# Patient Record
Sex: Female | Born: 1990
Health system: Southern US, Community
[De-identification: ages and names within clinical notes are randomized; demographics above are authoritative.]

## PROBLEM LIST (undated history)

## (undated) ENCOUNTER — Inpatient Hospital Stay (HOSPITAL_COMMUNITY): Payer: Self-pay

## (undated) DIAGNOSIS — Z789 Other specified health status: Secondary | ICD-10-CM

## (undated) DIAGNOSIS — B999 Unspecified infectious disease: Secondary | ICD-10-CM

## (undated) DIAGNOSIS — K219 Gastro-esophageal reflux disease without esophagitis: Secondary | ICD-10-CM

## (undated) DIAGNOSIS — B9689 Other specified bacterial agents as the cause of diseases classified elsewhere: Secondary | ICD-10-CM

## (undated) DIAGNOSIS — R12 Heartburn: Secondary | ICD-10-CM

## (undated) DIAGNOSIS — N760 Acute vaginitis: Secondary | ICD-10-CM

## (undated) HISTORY — DX: Gastro-esophageal reflux disease without esophagitis: K21.9

---

## 2009-10-20 ENCOUNTER — Ambulatory Visit (HOSPITAL_COMMUNITY): Admission: RE | Admit: 2009-10-20 | Discharge: 2009-10-20 | Payer: Self-pay | Admitting: Obstetrics & Gynecology

## 2010-03-19 ENCOUNTER — Ambulatory Visit (HOSPITAL_COMMUNITY): Admission: RE | Admit: 2010-03-19 | Discharge: 2010-03-19 | Payer: Self-pay | Admitting: Obstetrics & Gynecology

## 2010-03-19 ENCOUNTER — Inpatient Hospital Stay (HOSPITAL_COMMUNITY): Admission: AD | Admit: 2010-03-19 | Discharge: 2010-03-22 | Payer: Self-pay | Admitting: Obstetrics & Gynecology

## 2011-01-10 LAB — CBC
Hemoglobin: 8.1 g/dL — ABNORMAL LOW (ref 12.0–15.0)
Hemoglobin: 9.5 g/dL — ABNORMAL LOW (ref 12.0–15.0)
MCHC: 32.6 g/dL (ref 30.0–36.0)
MCV: 70.5 fL — ABNORMAL LOW (ref 78.0–100.0)
RBC: 3.49 MIL/uL — ABNORMAL LOW (ref 3.87–5.11)
RDW: 18.2 % — ABNORMAL HIGH (ref 11.5–15.5)
WBC: 10.5 10*3/uL (ref 4.0–10.5)
WBC: 11.3 10*3/uL — ABNORMAL HIGH (ref 4.0–10.5)

## 2011-08-04 ENCOUNTER — Other Ambulatory Visit: Payer: Self-pay | Admitting: Obstetrics

## 2011-08-07 ENCOUNTER — Inpatient Hospital Stay (INDEPENDENT_AMBULATORY_CARE_PROVIDER_SITE_OTHER)
Admission: RE | Admit: 2011-08-07 | Discharge: 2011-08-07 | Disposition: A | Discharge status: Home / Self Care | Payer: BC Managed Care – PPO | Source: Ambulatory Visit | Attending: Family Medicine | Admitting: Family Medicine

## 2011-08-07 DIAGNOSIS — J029 Acute pharyngitis, unspecified: Secondary | ICD-10-CM

## 2011-08-17 ENCOUNTER — Other Ambulatory Visit: Payer: Self-pay | Admitting: Obstetrics

## 2012-02-06 ENCOUNTER — Encounter (HOSPITAL_COMMUNITY): Payer: Self-pay | Admitting: *Deleted

## 2012-02-06 ENCOUNTER — Emergency Department (HOSPITAL_COMMUNITY)
Admission: EM | Admit: 2012-02-06 | Discharge: 2012-02-06 | Disposition: A | Discharge status: Home / Self Care | Payer: BC Managed Care – PPO | Attending: Emergency Medicine | Admitting: Emergency Medicine

## 2012-02-06 ENCOUNTER — Emergency Department (HOSPITAL_COMMUNITY): Payer: BC Managed Care – PPO

## 2012-02-06 DIAGNOSIS — M25529 Pain in unspecified elbow: Secondary | ICD-10-CM | POA: Insufficient documentation

## 2012-02-06 DIAGNOSIS — IMO0001 Reserved for inherently not codable concepts without codable children: Secondary | ICD-10-CM | POA: Insufficient documentation

## 2012-02-06 DIAGNOSIS — M25521 Pain in right elbow: Secondary | ICD-10-CM

## 2012-02-06 MED ORDER — IBUPROFEN 800 MG PO TABS: 800.0000 mg | ORAL_TABLET | Freq: Three times a day (TID) | ORAL | Status: AC
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
## 2012-02-06 NOTE — Discharge Instructions (Signed)
Your x-rays today were normal and did not show signs of any broken bones. This may be a bone bruise. Please apply ice to the area and elevate the arm. Use ibuprofen 3 times daily as needed for pain. If you're not improving, please consider making a followup appointment with your primary care doctor. If you develop numbness, weakness, tingling, noticed changes in the color of your skin, or have any other worrisome symptoms, please return to the ED.  RESOURCE GUIDE  Dental Problems  Patients with Medicaid: [facility] [phone] [address]                                           [phone] [address] Phone:  [phone]                                                  Phone:  [phone]  If unable to pay or uninsured, contact:  [facility] or [facility] to become qualified for the adult dental clinic.  Chronic Pain Problems Contact [facility]  [phone] Patients need to be referred by their primary care doctor.  Insufficient Money for Medicine Contact [employer]:  call "211" or [facility] [phone].  No Primary Care Doctor Call [facility]  [phone] Other agencies that provide inexpensive medical care    [facility]  [phone]    [facility]  [phone]    [facility]  [phone]    [facility]  [phone]    [facility]  [phone]    [facility]  [phone]  Psychological Services [facility] Behavioral Health  [phone] [facility]  [phone] [facility] Mental Health   [phone] (emergency services [phone])  Substance Abuse Resources [facility]  [phone] [facility] [phone] [facility] [place] [phone] Floydene Flock 4790664665 Residential & Outpatient Substance Abuse Program  [phone]  Abuse/Neglect [facility] Child Abuse Hotline 604-169-2568 [facility] Child  Abuse Hotline [phone] (After Hours)  Emergency Shelter [facility] Ministries [phone]  Maternity Homes [facility] at the [place] [facility] [place] [phone] [facility] Services 2294999366  MRSA Hotline #:   (956) 723-9270    [facility] Resources  [facility]     [employer]                          [facility] Dept. [address] [place]                       [address]      [address]  Patrecia Pace  Michell Heinrich Phone:  [phone]                                   Phone:  [phone]                 Phone:  [phone]  [facility] Phone:  [phone]  [facility] Child Abuse Hotline [phone] (After Hours)  Elbow Injury Minor fractures, sprains, and bruises of the elbow will all cause swelling and pain. X-rays often show swelling around the joint but may not show a fracture line on x-rays taken right after the injury. The treatment for all these types of injuries is to reduce swelling and pain and to rest the joint until movement is painless. Repeat exam and x-rays several weeks after an elbow injury may show a minor fracture not seen on the initial exam. Most of the time a sling is needed for the first days or weeks after the injury. Apply ice packs to the elbow for 20-30 minutes every 2 hours for the next few days. Keep your elbow elevated above the level of your heart as much as possible until the pain and swelling are better. An elastic wrap or splint may also be used to reduce movement in addition to a sling. Call your caregiver for follow-up care within one week. Keeping the elbow immobilized for too long can hurt recovery.  SEEK MEDICAL CARE IF:   Your pain increases, or if you develop a numb, cold, or pale forearm or hand.   You are not improving.   You have any other questions or concerns  regarding your injury.  Document Released: 11/17/2004 Document Revised: 09/29/2011 Document Reviewed: 10/29/2008 [facility] [place] [facility] Patient Information 2012 [place], [place].Elbow Injury Minor fractures, sprains, and bruises of the elbow will all cause swelling and pain. X-rays often show swelling around the joint but may not show a fracture line on x-rays taken right after the injury. The treatment for all these types of injuries is to reduce swelling and pain and to rest the joint until movement is painless. Repeat exam and x-rays several weeks after an elbow injury may show a minor fracture not seen on the initial exam. Most of the time a sling is needed for the first days or weeks after the injury. Apply ice packs to the elbow for 20-30 minutes every 2 hours for the next few days. Keep your elbow elevated above the level of your heart as much as possible until the pain and swelling are better. An elastic wrap or splint may also be used to reduce movement in addition to a sling. Call your caregiver for follow-up care within one week. Keeping the elbow immobilized for too long can hurt recovery.  SEEK MEDICAL CARE IF:   Your pain increases, or if you develop a numb, cold, or pale forearm or hand.   You are not improving.   You have any other questions or concerns regarding your injury.  Document Released: 11/17/2004 Document Revised: 09/29/2011 Document Reviewed: 10/29/2008 [facility] Patient Information 2012 [place], [place].

## 2012-02-07 NOTE — ED Provider Notes (Signed)
Medical screening examination/treatment/procedure(s) were performed by non-physician practitioner and as supervising physician I was immediately available for consultation/collaboration.  Ethelda Chick, MD 02/07/12 1524

## 2012-04-24 ENCOUNTER — Encounter (HOSPITAL_COMMUNITY): Payer: Self-pay | Admitting: *Deleted

## 2012-04-24 ENCOUNTER — Emergency Department (HOSPITAL_COMMUNITY)
Admission: EM | Admit: 2012-04-24 | Discharge: 2012-04-25 | Disposition: A | Discharge status: Home / Self Care | Payer: BC Managed Care – PPO | Attending: Emergency Medicine | Admitting: Emergency Medicine

## 2012-04-24 DIAGNOSIS — G43909 Migraine, unspecified, not intractable, without status migrainosus: Secondary | ICD-10-CM

## 2012-04-24 MED ORDER — KETOROLAC TROMETHAMINE 60 MG/2ML IM SOLN
60.0000 mg | Freq: Once | INTRAMUSCULAR | Status: AC
  Administered 2012-04-24: 60 mg via INTRAMUSCULAR
  Filled 2012-04-24: qty 2

## 2012-04-24 MED ORDER — METOCLOPRAMIDE HCL 10 MG PO TABS
10.0000 mg | ORAL_TABLET | Freq: Once | ORAL | Status: AC
  Administered 2012-04-24: 10 mg via ORAL
  Filled 2012-04-24: qty 1

## 2012-04-24 MED ORDER — DIPHENHYDRAMINE HCL 25 MG PO CAPS
25.0000 mg | ORAL_CAPSULE | Freq: Once | ORAL | Status: AC
  Administered 2012-04-24: 25 mg via ORAL
  Filled 2012-04-24: qty 1

## 2012-04-24 NOTE — ED Notes (Signed)
Pt states she is having migraine for about 5 days. Pt states she has had n/v with decrease in appetite. Pt denies any blurred vision or dizziness

## 2012-04-25 LAB — POCT PREGNANCY, URINE: Preg Test, Ur: NEGATIVE

## 2012-04-25 NOTE — ED Notes (Signed)
Pt c/o light sensitivity. Lights dimmed in room. Pt denies nausea at present.

## 2012-04-28 NOTE — ED Provider Notes (Signed)
History     CSN: [id number]  Arrival date & time 04/24/12  2124   First MD Initiated Contact with Patient 04/24/12 2320      Chief Complaint  Patient presents with  . Migraine    (Consider location/radiation/quality/duration/timing/severity/associated sxs/prior treatment) Patient is a 21 y.o. female presenting with migraine. The history is provided by the patient. No language interpreter was used.  Migraine This is a recurrent problem. The current episode started in the past 7 days. The problem occurs daily. The problem has been unchanged. Associated symptoms include headaches, nausea and vomiting. Pertinent negatives include no fever, neck pain, numbness or weakness. The symptoms are aggravated by stress. She has tried NSAIDs for the symptoms. The treatment provided mild relief.  Report migraine intermittant x 5 days.  Photophobia nausea and vomiting today.  Neuro in tact.  Vss.  No neck pain or fever.  History reviewed. No pertinent past medical history.  History reviewed. No pertinent past surgical history.  No family history on file.  History  Substance Use Topics  . Smoking status: Never Smoker   . Smokeless tobacco: Not on file  . Alcohol Use: Yes     occa    OB History    Grav Para Term Preterm Abortions TAB SAB Ect Mult Living                  Review of Systems  Constitutional: Negative.  Negative for fever.  HENT: Negative for neck pain.   Eyes: Positive for photophobia.  Respiratory: Negative.   Cardiovascular: Negative.   Gastrointestinal: Positive for nausea and vomiting.  Neurological: Positive for headaches. Negative for dizziness, facial asymmetry, speech difficulty, weakness and numbness.  Psychiatric/Behavioral: Negative.   All other systems reviewed and are negative.    Allergies  Review of patient's allergies indicates no known allergies.  Home Medications   Current Outpatient Rx  Name Route Sig Dispense Refill  . IBUPROFEN 400 MG PO TABS  Oral Take 400 mg by mouth every 6 (six) hours as needed. For pain relief      BP 117/62  Pulse 75  Temp 98.4 F (36.9 C) (Oral)  Resp 16  SpO2 100%  LMP 03/29/2012  Physical Exam  Nursing note and vitals reviewed. Constitutional: She is oriented to person, place, and time. She appears well-developed and well-nourished.  HENT:  Head: Normocephalic and atraumatic.  Eyes: Conjunctivae and EOM are normal. Pupils are equal, round, and reactive to light.  Neck: Normal range of motion. Neck supple.  Cardiovascular: Normal rate.   Pulmonary/Chest: Effort normal.  Abdominal: Soft.  Musculoskeletal: Normal range of motion. She exhibits no edema and no tenderness.  Neurological: She is alert and oriented to person, place, and time. She has normal strength and normal reflexes. No cranial nerve deficit or sensory deficit. She displays a negative Romberg sign. GCS eye subscore is 4. GCS verbal subscore is 5. GCS motor subscore is 6.  Skin: Skin is warm and dry.  Psychiatric: She has a normal mood and affect.    ED Course  Procedures (including critical care time)   Labs Reviewed  POCT PREGNANCY, URINE  LAB REPORT - SCANNED   No results found.   1. Migraine       MDM  Treated for migraine h/a with migraine cocktail in the ER with relief.  Will follow up with pcp of choice to get rx for preventive meds.  Practice stress reducing techniques. Return if worse.  Remi Haggard, NP 04/28/12 1121

## 2012-04-29 NOTE — ED Provider Notes (Signed)
Medical screening examination/treatment/procedure(s) were performed by non-physician practitioner and as supervising physician I was immediately available for consultation/collaboration.  Julena Barbour T Zakar Brosch, MD 04/29/12 1510 

## 2012-06-23 ENCOUNTER — Encounter (HOSPITAL_COMMUNITY): Payer: Self-pay | Admitting: Obstetrics and Gynecology

## 2012-06-23 ENCOUNTER — Inpatient Hospital Stay (HOSPITAL_COMMUNITY)
Admission: AD | Admit: 2012-06-23 | Discharge: 2012-06-23 | Disposition: A | Discharge status: Home / Self Care | Payer: BC Managed Care – PPO | Source: Ambulatory Visit | Attending: Obstetrics | Admitting: Obstetrics

## 2012-06-23 DIAGNOSIS — N949 Unspecified condition associated with female genital organs and menstrual cycle: Secondary | ICD-10-CM | POA: Insufficient documentation

## 2012-06-23 DIAGNOSIS — B373 Candidiasis of vulva and vagina: Secondary | ICD-10-CM

## 2012-06-23 DIAGNOSIS — N760 Acute vaginitis: Secondary | ICD-10-CM | POA: Insufficient documentation

## 2012-06-23 DIAGNOSIS — B9689 Other specified bacterial agents as the cause of diseases classified elsewhere: Secondary | ICD-10-CM | POA: Insufficient documentation

## 2012-06-23 DIAGNOSIS — A499 Bacterial infection, unspecified: Secondary | ICD-10-CM

## 2012-06-23 DIAGNOSIS — B3731 Acute candidiasis of vulva and vagina: Secondary | ICD-10-CM | POA: Insufficient documentation

## 2012-06-23 HISTORY — DX: Other specified health status: Z78.9

## 2012-06-23 LAB — WET PREP, GENITAL

## 2012-06-23 MED ORDER — METRONIDAZOLE 500 MG PO TABS: 500.0000 mg | ORAL_TABLET | Freq: Two times a day (BID) | ORAL | Status: AC

## 2012-06-23 MED ORDER — FLUCONAZOLE 150 MG PO TABS: 150.0000 mg | ORAL_TABLET | Freq: Once | ORAL | Status: DC

## 2012-06-23 MED ORDER — FLUCONAZOLE 150 MG PO TABS: 150.0000 mg | ORAL_TABLET | Freq: Once | ORAL | Status: AC

## 2012-06-23 NOTE — MAU Note (Signed)
"  I've had vaginal irritation since Tuesday.  I treated myself with Monistat 2 day Tuesday and Wednesday.  It seems like it got worse since then.  Lots of itching and burning.  No odor since then.  Last intercourse x 1 week ago."

## 2012-06-23 NOTE — MAU Note (Signed)
Patient states she has had a thick white vaginal discharge for 4 days. Used OTC 2 day Monistat and seems to have made the vaginal irritation worse. A lot of discomfort and itching.

## 2012-06-23 NOTE — MAU Provider Note (Signed)
Chief Complaint: Vaginal Discharge  First Provider Initiated Contact with Patient 06/23/12 1814     SUBJECTIVE HPI: Mary Mora is a 21 y.o. G2P0011who presents with thick, white vaginal discharge and vaginal and vulvar itching for 4 days. Used OTC 2 day Monistat and seems to have made the vaginal irritation worse. Denies abdominal pain, fever, chills, vaginal odor, dyspareunia, urinary symptoms.  Past Medical History  Diagnosis Date  . No pertinent past medical history    OB History    Grav Para Term Preterm Abortions TAB SAB Ect Mult Living   2 1   1 1    1      # Outc Date GA Lbr Len/2nd Wgt Sex Del Anes PTL Lv   1 PAR 5/11    M SVD EPI No Yes   2 TAB 5/12           Comments: no complications     Past Surgical History  Procedure Date  . No past surgeries    History   Social History  . Marital Status: Single    Spouse Name: N/A    Number of Children: N/A  . Years of Education: N/A   Occupational History  . Not on file.   Social History Main Topics  . Smoking status: Never Smoker   . Smokeless tobacco: Not on file  . Alcohol Use: Yes     occassional  . Drug Use: No  . Sexually Active: Yes    Birth Control/ Protection: Inserts   Other Topics Concern  . Not on file   Social History Narrative  . No narrative on file   No current facility-administered medications on file prior to encounter.   Current Outpatient Prescriptions on File Prior to Encounter  Medication Sig Dispense Refill  . ibuprofen (ADVIL,MOTRIN) 400 MG tablet Take 400 mg by mouth every 6 (six) hours as needed. For pain relief       No Known Allergies  ROS: Pertinent items in HPI  OBJECTIVE Blood pressure 132/70, pulse 84, temperature 99.5 F (37.5 C), temperature source Oral, resp. rate 16, height 5\' 3"  (1.6 m), weight 85.004 kg (187 lb 6.4 oz), last menstrual period 06/04/2012, SpO2 100.00%, unknown if currently breastfeeding. GENERAL: Well-developed, well-nourished female in no acute  distress.  HEENT: Normocephalic HEART: normal rate RESP: normal effort ABDOMEN: Soft, non-tender EXTREMITIES: Nontender, no edema NEURO: Alert and oriented SPECULUM EXAM: NEFG, slight excoriation on right labia majora, large amount of clumpy, white, odorless discharge (some of which is Monistat), vaginal walls erythematous, without lesions, no blood noted, cervix clean BIMANUAL: cervix closed; uterus normal size, no adnexal tenderness or masses  LAB RESULTS Results for orders placed during the hospital encounter of 06/23/12 (from the past 24 hour(s))  WET PREP, GENITAL     Status: Abnormal   Collection Time   06/23/12  6:25 PM      Component Value Range   Yeast Wet Prep HPF POC MANY (*) NONE SEEN   Trich, Wet Prep NONE SEEN  NONE SEEN   Clue Cells Wet Prep HPF POC FEW (*) NONE SEEN   WBC, Wet Prep HPF POC MANY (*) NONE SEEN   IMAGING N/A  ASSESSMENT 1. BV (bacterial vaginosis)   2. Vulvovaginal candidiasis    PLAN Discharge home Medication List  As of 06/23/2012  9:01 PM   TAKE these medications         fluconazole 150 MG tablet   Commonly known as: DIFLUCAN   Take 1  tablet (150 mg total) by mouth once. If no relief of symptoms after 3 days, take second tablet.      ibuprofen 400 MG tablet   Commonly known as: ADVIL,MOTRIN   Take 400 mg by mouth every 6 (six) hours as needed. For pain relief      metroNIDAZOLE 500 MG tablet   Commonly known as: FLAGYL   Take 1 tablet (500 mg total) by mouth 2 (two) times daily.           Follow-up Information    Follow up with Gynecologist. (As needed if symptoms worsen)          Dorathy Kinsman, CNM 06/23/2012  6:01 PM

## 2012-06-26 LAB — GC/CHLAMYDIA PROBE AMP, GENITAL
Chlamydia, DNA Probe: NEGATIVE
GC Probe Amp, Genital: NEGATIVE

## 2012-12-29 ENCOUNTER — Encounter (HOSPITAL_COMMUNITY): Payer: Self-pay | Admitting: Family

## 2012-12-29 ENCOUNTER — Inpatient Hospital Stay (HOSPITAL_COMMUNITY)
Admission: AD | Admit: 2012-12-29 | Discharge: 2012-12-29 | Disposition: A | Discharge status: Home / Self Care | Payer: BC Managed Care – PPO | Source: Ambulatory Visit | Attending: Obstetrics & Gynecology | Admitting: Obstetrics & Gynecology

## 2012-12-29 DIAGNOSIS — R079 Chest pain, unspecified: Secondary | ICD-10-CM | POA: Insufficient documentation

## 2012-12-29 DIAGNOSIS — K219 Gastro-esophageal reflux disease without esophagitis: Secondary | ICD-10-CM

## 2012-12-29 HISTORY — DX: Unspecified infectious disease: B99.9

## 2012-12-29 HISTORY — DX: Other specified bacterial agents as the cause of diseases classified elsewhere: B96.89

## 2012-12-29 HISTORY — DX: Heartburn: R12

## 2012-12-29 HISTORY — DX: Other specified bacterial agents as the cause of diseases classified elsewhere: N76.0

## 2012-12-29 MED ORDER — OMEPRAZOLE 20 MG PO CPDR: 20.0000 mg | DELAYED_RELEASE_CAPSULE | Freq: Every day | ORAL | Status: DC
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
## 2012-12-29 NOTE — MAU Note (Signed)
Patient presents to MAU with c/o of intermittent chest pain, although reports she has no s/s at the present time. She reports the feeling comes most often after she eats something heavy.  Reports it feels different than heart burn, reports a sharp pain in the middle of chest. Reports that pain usually last no longer than 5 minutes per occurrence and has had 3-4 occurences of this pain within last month.  Patient denies diet changes, caffeine increases, stressors, or recreational/medicinal drug use.

## 2012-12-29 NOTE — MAU Note (Signed)
Pt presents with complaints of pain in her chest for approximately 1 month on and off. Says that it hurts for like 5 mins when it happens. Denies any SOB

## 2012-12-29 NOTE — MAU Provider Note (Signed)
Attestation of Attending Supervision of Advanced Practitioner (CNM/NP): Evaluation and management procedures were performed by the Advanced Practitioner under my supervision and collaboration.  I have reviewed the Advanced Practitioner's note and chart, and I agree with the management and plan.  Khalon Cansler 12/29/2012 2:14 PM
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
## 2014-01-22 ENCOUNTER — Ambulatory Visit: Payer: BC Managed Care – PPO | Admitting: Obstetrics & Gynecology

## 2014-02-20 ENCOUNTER — Ambulatory Visit: Payer: BC Managed Care – PPO | Admitting: Obstetrics & Gynecology

## 2014-04-28 ENCOUNTER — Other Ambulatory Visit (INDEPENDENT_AMBULATORY_CARE_PROVIDER_SITE_OTHER): Payer: BC Managed Care – PPO

## 2014-04-28 VITALS — BP 111/74 | HR 87 | Temp 98.9°F | Ht 61.0 in | Wt 177.0 lb

## 2014-04-28 DIAGNOSIS — Z3201 Encounter for pregnancy test, result positive: Secondary | ICD-10-CM

## 2014-04-28 LAB — POCT URINE PREGNANCY: PREG TEST UR: POSITIVE

## 2014-04-28 NOTE — Progress Notes (Signed)
Patient is in the office today for UPT, confirmation of pregnancy. UPT preformed, results were positive. Patient states this was not a planned pregnancy but that she plan on keeping the pregnancy. Patient states her LMP was 03/09/2014 which makes her EDD: 12/14/2014. Patient advised to sit in subwait and that the nurse who schedules NOB appointments would come and get her to set up her appointment.   BP 111/74  Pulse 87  Temp(Src) 98.9 F (37.2 C)  Ht 5\' 1"  (1.549 m)  Wt 177 lb (80.287 kg)  BMI 33.46 kg/m2  LMP 03/09/2014  Results for orders placed in visit on 04/28/14 (from the past 24 hour(s))  POCT URINE PREGNANCY     Status: None   Collection Time    04/28/14  3:33 PM      Result Value Ref Range   Preg Test, Ur Positive

## 2014-05-26 ENCOUNTER — Ambulatory Visit: Payer: BC Managed Care – PPO | Admitting: Obstetrics & Gynecology

## 2014-05-26 ENCOUNTER — Encounter: Payer: Self-pay | Admitting: Obstetrics & Gynecology

## 2014-06-11 ENCOUNTER — Other Ambulatory Visit: Payer: Self-pay | Admitting: Obstetrics & Gynecology

## 2014-06-11 ENCOUNTER — Ambulatory Visit (INDEPENDENT_AMBULATORY_CARE_PROVIDER_SITE_OTHER): Payer: BC Managed Care – PPO | Admitting: Obstetrics & Gynecology

## 2014-06-11 ENCOUNTER — Encounter: Payer: Self-pay | Admitting: Obstetrics & Gynecology

## 2014-06-11 VITALS — BP 140/88 | HR 120 | Temp 99.1°F | Wt 174.0 lb

## 2014-06-11 DIAGNOSIS — Z3480 Encounter for supervision of other normal pregnancy, unspecified trimester: Secondary | ICD-10-CM | POA: Insufficient documentation

## 2014-06-11 DIAGNOSIS — Z3482 Encounter for supervision of other normal pregnancy, second trimester: Secondary | ICD-10-CM

## 2014-06-11 LAB — POCT URINALYSIS DIPSTICK
BILIRUBIN UA: NEGATIVE
Glucose, UA: NEGATIVE
Ketones, UA: NEGATIVE
Leukocytes, UA: NEGATIVE
NITRITE UA: NEGATIVE
PH UA: 5
Protein, UA: NEGATIVE
RBC UA: NEGATIVE
Spec Grav, UA: 1.025
UROBILINOGEN UA: NEGATIVE

## 2014-06-11 LAB — OB RESULTS CONSOLE GC/CHLAMYDIA
Chlamydia: NEGATIVE
Gonorrhea: NEGATIVE

## 2014-06-11 NOTE — Progress Notes (Signed)
Subjective:    Mary Mora is being seen today for her first obstetrical visit.  This is not a planned pregnancy. She is at [redacted]w[redacted]d gestation.  Relationship with FOB: significant other, living together. Patient does intend to breast feed. Pregnancy history fully reviewed.  The information documented in the HPI was reviewed and verified.  Menstrual History: OB History   Grav Para Term Preterm Abortions TAB SAB Ect Mult Living   3 1 1  1 1    1        Patient's last menstrual period was 03/09/2014.    Past Medical History  Diagnosis Date  . No pertinent past medical history   . Infection   . BV (bacterial vaginosis)   . Heart burn     Past Surgical History  Procedure Laterality Date  . No past surgeries       (Not in a hospital admission) No Known Allergies  History  Substance Use Topics  . Smoking status: Never Smoker   . Smokeless tobacco: Never Used  . Alcohol Use: No    Family History  Problem Relation Age of Onset  . Hypertension Mother   . Diabetes Maternal Grandmother   . Kidney disease Maternal Grandmother     on dialysis     Review of Systems Constitutional: negative for weight loss Gastrointestinal: negative for vomiting Genitourinary:negative for genital lesions and vaginal discharge and dysuria Musculoskeletal:negative for back pain Behavioral/Psych: negative for abusive relationship, depression, illegal drug usage and tobacco use    Objective:    BP 140/88  Pulse 120  Temp(Src) 99.1 F (37.3 C)  Wt 78.926 kg (174 lb)  LMP 03/09/2014 General Appearance:    Alert, cooperative, no distress, appears stated age  Head:    Normocephalic, without obvious abnormality, atraumatic  Eyes:    PERRL, conjunctiva/corneas clear, EOM's intact, fundi    benign, both eyes  Ears:    Normal TM's and external ear canals, both ears  Nose:   Nares normal, septum midline, mucosa normal, no drainage    or sinus tenderness  Throat:   Lips, mucosa, and tongue  normal; teeth and gums normal  Neck:   Supple, symmetrical, trachea midline, no adenopathy;    thyroid:  no enlargement/tenderness/nodules; no carotid   bruit or JVD  Back:     Symmetric, no curvature, ROM normal, no CVA tenderness  Lungs:     Clear to auscultation bilaterally, respirations unlabored  Chest Wall:    No tenderness or deformity   Heart:    Regular rate and rhythm, S1 and S2 normal, no murmur, rub   or gallop  Breast Exam:    No tenderness, masses, or nipple abnormality  Abdomen:     Soft, non-tender, bowel sounds active all four quadrants,    no masses, no organomegaly  Genitalia:    Normal female without lesion, discharge or tenderness  Extremities:   Extremities normal, atraumatic, no cyanosis or edema  Pulses:   2+ and symmetric all extremities  Skin:   Skin color, texture, turgor normal, no rashes or lesions  Lymph nodes:   Cervical, supraclavicular, and axillary nodes normal  Neurologic:   CNII-XII intact, normal strength, sensation and reflexes    throughout      Lab Review Urine pregnancy test Labs reviewed no Radiologic studies reviewed no Assessment:    Pregnancy at [redacted]w[redacted]d weeks    Plan:      Prenatal vitamins.  Counseling provided regarding continued use of seat belts, cessation  of alcohol consumption, smoking or use of illicit drugs; infection precautions i.e., influenza/TDAP immunizations, toxoplasmosis,CMV, parvovirus, listeria and varicella; workplace safety, exercise during pregnancy; routine dental care, safe medications, sexual activity, hot tubs, saunas, pools, travel, caffeine use, fish and methlymercury, potential toxins, hair treatments, varicose veins Weight gain recommendations per IOM guidelines reviewed:  obese/BMI >30->gain  11 - 20 lbs Problem list reviewed and updated. CF mutation testing/QUAD SCREEN/fragile X /Spinal muscular atrophy discussed: Role of ultrasound in pregnancy discussed Amniocentesis discussed: not indicated. VBAC  calculator score: VBAC consent form provided  Meds ordered this encounter  Medications  . Prenatal Vit-Fe Fumarate-FA (PRENATAL MULTIVITAMIN) TABS tablet    Sig: Take 1 tablet by mouth daily at 12 noon.   Orders Placed This Encounter  Procedures  . Culture, OB Urine  . Obstetric panel  . HIV antibody  . Hemoglobinopathy evaluation  . Varicella zoster antibody, IgG  . Vit D  25 hydroxy (rtn osteoporosis monitoring)  . POCT urine pregnancy    Follow up in 4 weeks.

## 2014-06-12 LAB — OBSTETRIC PANEL
Antibody Screen: NEGATIVE
BASOS ABS: 0 10*3/uL (ref 0.0–0.1)
Basophils Relative: 0 % (ref 0–1)
EOS ABS: 0.1 10*3/uL (ref 0.0–0.7)
EOS PCT: 1 % (ref 0–5)
HCT: 28.3 % — ABNORMAL LOW (ref 36.0–46.0)
HEMOGLOBIN: 8.6 g/dL — AB (ref 12.0–15.0)
Hepatitis B Surface Ag: NEGATIVE
LYMPHS PCT: 17 % (ref 12–46)
Lymphs Abs: 1.5 10*3/uL (ref 0.7–4.0)
MCH: 18.5 pg — AB (ref 26.0–34.0)
MCHC: 30.4 g/dL (ref 30.0–36.0)
MCV: 60.9 fL — AB (ref 78.0–100.0)
Monocytes Absolute: 0.3 10*3/uL (ref 0.1–1.0)
Monocytes Relative: 4 % (ref 3–12)
NEUTROS ABS: 6.7 10*3/uL (ref 1.7–7.7)
NEUTROS PCT: 78 % — AB (ref 43–77)
Platelets: 408 10*3/uL — ABNORMAL HIGH (ref 150–400)
RBC: 4.65 MIL/uL (ref 3.87–5.11)
RDW: 20.1 % — AB (ref 11.5–15.5)
RH TYPE: POSITIVE
RUBELLA: 1.67 {index} — AB (ref <0.90)
WBC: 8.6 10*3/uL (ref 4.0–10.5)

## 2014-06-12 LAB — WET PREP BY MOLECULAR PROBE
CANDIDA SPECIES: POSITIVE — AB
GARDNERELLA VAGINALIS: NEGATIVE
TRICHOMONAS VAG: NEGATIVE

## 2014-06-12 LAB — GC/CHLAMYDIA PROBE AMP
CT Probe RNA: NEGATIVE
GC Probe RNA: NEGATIVE

## 2014-06-12 LAB — VARICELLA ZOSTER ANTIBODY, IGG: Varicella IgG: 810.8 Index — ABNORMAL HIGH (ref <135.00)

## 2014-06-12 LAB — VITAMIN D 25 HYDROXY (VIT D DEFICIENCY, FRACTURES): Vit D, 25-Hydroxy: <10 ng/mL — ABNORMAL LOW (ref 30–89)

## 2014-06-12 LAB — TSH: TSH: 0.127 u[IU]/mL — ABNORMAL LOW (ref 0.350–4.500)

## 2014-06-12 LAB — CULTURE, OB URINE
Colony Count: NO GROWTH
ORGANISM ID, BACTERIA: NO GROWTH

## 2014-06-12 LAB — HIV ANTIBODY (ROUTINE TESTING W REFLEX): HIV: NONREACTIVE

## 2014-06-13 LAB — HEMOGLOBINOPATHY EVALUATION
Hemoglobin Other: 0 %
Hgb A2 Quant: 2.1 % — ABNORMAL LOW (ref 2.2–3.2)
Hgb A: 97.9 % — ABNORMAL HIGH (ref 96.8–97.8)
Hgb F Quant: 0 % (ref 0.0–2.0)
Hgb S Quant: 0 %

## 2014-06-13 NOTE — Patient Instructions (Signed)
Prenatal Care  WHAT IS PRENATAL CARE?  Prenatal care means health care during your pregnancy, before your baby is born. It is very important to take care of yourself and your baby during your pregnancy by:   Getting early prenatal care. If you know you are pregnant, or think you might be pregnant, call your health care provider as soon as possible. Schedule a visit for a prenatal exam.  Getting regular prenatal care. Follow your health care provider's schedule for blood and other necessary tests. Do not miss appointments.  Doing everything you can to keep yourself and your baby healthy during your pregnancy.  Getting complete care. Prenatal care should include evaluation of the medical, dietary, educational, psychological, and social needs of you and your significant other. The medical and genetic history of your family and the family of your baby's father should be discussed with your health care provider.  Discussing with your health care provider:  Prescription, over-the-counter, and herbal medicines that you take.  Any history of substance abuse, alcohol use, smoking, and illegal drug use.  Any history of domestic abuse and violence.  Immunizations you have received.  Your nutrition and diet.  The amount of exercise you do.  Any environmental and occupational hazards to which you are exposed.  History of sexually transmitted infections for both you and your partner.  Previous pregnancies you have had. WHY IS PRENATAL CARE SO IMPORTANT?  By regularly seeing your health care provider, you help ensure that problems can be identified early so that they can be treated as soon as possible. Other problems might be prevented. Many studies have shown that early and regular prenatal care is important for the health of mothers and their babies.  HOW CAN I TAKE CARE OF MYSELF WHILE I AM PREGNANT?  Here are ways to take care of yourself and your baby:   Start or continue taking your  multivitamin with 400 micrograms (mcg) of folic acid every day.  Get early and regular prenatal care. It is very important to see a health care provider during your pregnancy. Your health care provider will check at each visit to make sure that you and your baby are healthy. If there are any problems, action can be taken right away to help you and your baby.  Eat a healthy diet that includes:  Fruits.  Vegetables.  Foods low in saturated fat.  Whole grains.  Calcium-rich foods, such as milk, yogurt, and hard cheeses.  Drink 6-8 glasses of liquids a day.  Unless your health care provider tells you not to, try to be physically active for 30 minutes, most days of the week. If you are pressed for time, you can get your activity in through 10-minute segments, three times a day.  Do not smoke, drink alcohol, or use drugs. These can cause long-term damage to your baby. Talk with your health care provider about steps to take to stop smoking. Talk with a member of your faith community, a counselor, a trusted friend, or your health care provider if you are concerned about your alcohol or drug use.  Ask your health care provider before taking any medicine, even over-the-counter medicines. Some medicines are not safe to take during pregnancy.  Get plenty of rest and sleep.  Avoid hot tubs and saunas during pregnancy.  Do not have X-rays taken unless absolutely necessary and with the recommendation of your health care provider. A lead shield can be placed on your abdomen to protect your baby when   X-rays are taken in other parts of your body.  Do not empty the cat litter when you are pregnant. It may contain a parasite that causes an infection called toxoplasmosis, which can cause birth defects. Also, use gloves when working in garden areas used by cats.  Do not eat uncooked or undercooked meats or fish.  Do not eat soft, mold-ripened cheeses (Brie, Camembert, and chevre) or soft, blue-veined  cheese (Danish blue and Roquefort).  Stay away from toxic chemicals like:  Insecticides.  Solvents (some cleaners or paint thinners).  Lead.  Mercury.  Sexual intercourse may continue until the end of the pregnancy, unless you have a medical problem or there is a problem with the pregnancy and your health care provider tells you not to.  Do not wear high-heel shoes, especially during the second half of the pregnancy. You can lose your balance and fall.  Do not take long trips, unless absolutely necessary. Be sure to see your health care provider before going on the trip.  Do not sit in one position for more than 2 hours when on a trip.  Take a copy of your medical records when going on a trip. Know where a hospital is located in the city you are visiting, in case of an emergency.  Most dangerous household products will have pregnancy warnings on their labels. Ask your health care provider about products if you are unsure.  Limit or eliminate your caffeine intake from coffee, tea, sodas, medicines, and chocolate.  Many women continue working through pregnancy. Staying active might help you stay healthier. If you have a question about the safety or the hours you work at your particular job, talk with your health care provider.  Get informed:  Read books.  Watch videos.  Go to childbirth classes for you and your significant other.  Talk with experienced moms.  Ask your health care provider about childbirth education classes for you and your partner. Classes can help you and your partner prepare for the birth of your baby.  Ask about a baby doctor (pediatrician) and methods and pain medicine for labor, delivery, and possible cesarean delivery. HOW OFTEN SHOULD I SEE MY HEALTH CARE PROVIDER DURING PREGNANCY?  Your health care provider will give you a schedule for your prenatal visits. You will have visits more often as you get closer to the end of your pregnancy. An average  pregnancy lasts about 40 weeks.  A typical schedule includes visiting your health care provider:   About once each month during your first 6 months of pregnancy.  Every 2 weeks during the next 2 months.  Weekly in the last month, until the delivery date. Your health care provider will probably want to see you more often if:  You are older than 35 years.  Your pregnancy is high risk because you have certain health problems or problems with the pregnancy, such as:  Diabetes.  High blood pressure.  The baby is not growing on schedule, according to the dates of the pregnancy. Your health care provider will do special tests to make sure you and your baby are not having any serious problems. WHAT HAPPENS DURING PRENATAL VISITS?   At your first prenatal visit, your health care provider will do a physical exam and talk to you about your health history and the health history of your partner and your family. Your health care provider will be able to tell you what date to expect your baby to be born on.  Your   first physical exam will include checks of your blood pressure, measurements of your height and weight, and an exam of your pelvic organs. Your health care provider will do a Pap test if you have not had one recently and will do cultures of your cervix to make sure there is no infection.  At each prenatal visit, there will be tests of your blood, urine, blood pressure, weight, and the progress of the baby will be checked.  At your later prenatal visits, your health care provider will check how you are doing and how your baby is developing. You may have a number of tests done as your pregnancy progresses.  Ultrasound exams are often used to check on your baby's growth and health.  You may have more urine and blood tests, as well as special tests, if needed. These may include amniocentesis to examine fluid in the pregnancy sac, stress tests to check how the baby responds to contractions, or a  biophysical profile to measure your baby's well-being. Your health care provider will explain the tests and why they are necessary.  You should be tested for high blood sugar (gestational diabetes) between the 24th and 28th weeks of your pregnancy.  You should discuss with your health care provider your plans to breastfeed or bottle-feed your baby.  Each visit is also a chance for you to learn about staying healthy during pregnancy and to ask questions. Document Released: 10/13/2003 Document Revised: 10/15/2013 Document Reviewed: 12/25/2013 Exit[employer] Patient Information 2015 [employer]. This information is not intended to replace advice given to you by your health care provider. Make sure you discuss any questions you have with your health care provider.  

## 2014-06-15 ENCOUNTER — Encounter: Payer: Self-pay | Admitting: Obstetrics & Gynecology

## 2014-06-15 DIAGNOSIS — D509 Iron deficiency anemia, unspecified: Secondary | ICD-10-CM | POA: Insufficient documentation

## 2014-06-18 ENCOUNTER — Other Ambulatory Visit: Payer: Self-pay | Admitting: *Deleted

## 2014-06-19 ENCOUNTER — Other Ambulatory Visit: Payer: Self-pay | Admitting: *Deleted

## 2014-06-19 DIAGNOSIS — E559 Vitamin D deficiency, unspecified: Secondary | ICD-10-CM

## 2014-06-19 DIAGNOSIS — B379 Candidiasis, unspecified: Secondary | ICD-10-CM

## 2014-06-19 MED ORDER — FLUCONAZOLE 150 MG PO TABS: 150.0000 mg | ORAL_TABLET | Freq: Once | ORAL | Status: DC

## 2014-06-19 MED ORDER — OB COMPLETE PETITE 35-5-1-200 MG PO CAPS: 1.0000 | ORAL_CAPSULE | Freq: Every day | ORAL | Status: DC

## 2014-07-09 ENCOUNTER — Ambulatory Visit (INDEPENDENT_AMBULATORY_CARE_PROVIDER_SITE_OTHER): Payer: BC Managed Care – PPO | Admitting: Obstetrics

## 2014-07-09 VITALS — BP 127/77 | HR 97 | Temp 99.4°F | Wt 171.0 lb

## 2014-07-09 DIAGNOSIS — Z3480 Encounter for supervision of other normal pregnancy, unspecified trimester: Secondary | ICD-10-CM

## 2014-07-09 DIAGNOSIS — K219 Gastro-esophageal reflux disease without esophagitis: Secondary | ICD-10-CM

## 2014-07-09 DIAGNOSIS — Z1389 Encounter for screening for other disorder: Secondary | ICD-10-CM

## 2014-07-09 DIAGNOSIS — Z3482 Encounter for supervision of other normal pregnancy, second trimester: Secondary | ICD-10-CM

## 2014-07-09 MED ORDER — OMEPRAZOLE 20 MG PO CPDR: 20.0000 mg | DELAYED_RELEASE_CAPSULE | Freq: Two times a day (BID) | ORAL | Status: DC

## 2014-07-09 NOTE — Progress Notes (Signed)
Patient is feeling some pressure on the left - usually in the am.

## 2014-07-10 ENCOUNTER — Encounter: Payer: Self-pay | Admitting: Obstetrics

## 2014-07-10 ENCOUNTER — Other Ambulatory Visit: Payer: Self-pay | Admitting: *Deleted

## 2014-07-10 DIAGNOSIS — Z3482 Encounter for supervision of other normal pregnancy, second trimester: Secondary | ICD-10-CM

## 2014-07-10 NOTE — Progress Notes (Signed)
  Subjective:    Mary Mora is a 23 y.o. female being seen today for her obstetrical visit. She is at [redacted]w[redacted]d gestation. Patient reports: no complaints.  Problem List Items Addressed This Visit   Supervision of other normal pregnancy - Primary   Relevant Orders      POCT urinalysis dipstick    Other Visit Diagnoses   Encounter for routine screening for malformation using ultrasonics        Relevant Orders       US OB Comp + 14 Wk    GERD without esophagitis        Relevant Medications       omeprazole (PRILOSEC) capsule      Patient Active Problem List   Diagnosis Date Noted  . Anemia 06/15/2014  . Supervision of other normal pregnancy 06/11/2014    Objective:     BP 127/77  Pulse 97  Temp(Src) 99.4 F (37.4 C)  Wt 171 lb (77.565 kg)  LMP 03/09/2014 Uterine Size: Below umbilicus     Assessment:    Pregnancy @ [redacted]w[redacted]d  weeks Doing well    Plan:    Problem list reviewed and updated. Labs reviewed.  Follow up in 4 weeks. FIRST/CF mutation testing/NIPT/QUAD SCREEN/fragile X/[name] population testing/Spinal muscular atrophy discussed: requested. Role of ultrasound in pregnancy discussed; fetal survey: requested. Amniocentesis discussed: not indicated.

## 2014-07-15 LAB — POCT URINALYSIS DIPSTICK
Bilirubin, UA: NEGATIVE
Blood, UA: NEGATIVE
Glucose, UA: NEGATIVE
Leukocytes, UA: NEGATIVE
Nitrite, UA: NEGATIVE
Spec Grav, UA: >=1.03
Urobilinogen, UA: NEGATIVE
pH, UA: 5

## 2014-07-17 ENCOUNTER — Ambulatory Visit (HOSPITAL_COMMUNITY): Payer: BC Managed Care – PPO

## 2014-07-17 ENCOUNTER — Ambulatory Visit (HOSPITAL_COMMUNITY)
Admission: RE | Admit: 2014-07-17 | Discharge: 2014-07-17 | Disposition: A | Discharge status: Home / Self Care | Payer: BC Managed Care – PPO | Source: Ambulatory Visit | Attending: Obstetrics | Admitting: Obstetrics

## 2014-07-17 DIAGNOSIS — Z363 Encounter for antenatal screening for malformations: Secondary | ICD-10-CM | POA: Insufficient documentation

## 2014-07-17 DIAGNOSIS — Z1389 Encounter for screening for other disorder: Secondary | ICD-10-CM | POA: Diagnosis present

## 2014-07-18 ENCOUNTER — Other Ambulatory Visit: Payer: Self-pay | Admitting: Obstetrics

## 2014-07-18 DIAGNOSIS — O358XX0 Maternal care for other (suspected) fetal abnormality and damage, not applicable or unspecified: Secondary | ICD-10-CM

## 2014-07-24 ENCOUNTER — Ambulatory Visit (HOSPITAL_COMMUNITY): Payer: BC Managed Care – PPO

## 2014-08-01 ENCOUNTER — Ambulatory Visit (HOSPITAL_COMMUNITY)
Admission: RE | Admit: 2014-08-01 | Discharge: 2014-08-01 | Disposition: A | Discharge status: Home / Self Care | Payer: BC Managed Care – PPO | Source: Ambulatory Visit

## 2014-08-01 ENCOUNTER — Encounter (HOSPITAL_COMMUNITY): Payer: Self-pay

## 2014-08-01 ENCOUNTER — Ambulatory Visit (HOSPITAL_COMMUNITY)
Admission: RE | Admit: 2014-08-01 | Discharge: 2014-08-01 | Disposition: A | Discharge status: Home / Self Care | Payer: BC Managed Care – PPO | Source: Ambulatory Visit | Attending: Obstetrics | Admitting: Obstetrics

## 2014-08-01 VITALS — BP 122/63 | HR 94 | Wt 175.0 lb

## 2014-08-01 DIAGNOSIS — Z3689 Encounter for other specified antenatal screening: Secondary | ICD-10-CM

## 2014-08-01 DIAGNOSIS — Z3A20 20 weeks gestation of pregnancy: Secondary | ICD-10-CM

## 2014-08-01 DIAGNOSIS — O358XX0 Maternal care for other (suspected) fetal abnormality and damage, not applicable or unspecified: Secondary | ICD-10-CM | POA: Insufficient documentation

## 2014-08-01 DIAGNOSIS — O359XX0 Maternal care for (suspected) fetal abnormality and damage, unspecified, not applicable or unspecified: Secondary | ICD-10-CM

## 2014-08-06 ENCOUNTER — Other Ambulatory Visit: Payer: Self-pay | Admitting: Obstetrics & Gynecology

## 2014-08-06 ENCOUNTER — Encounter: Payer: Self-pay | Admitting: Obstetrics & Gynecology

## 2014-08-06 ENCOUNTER — Ambulatory Visit (INDEPENDENT_AMBULATORY_CARE_PROVIDER_SITE_OTHER): Payer: BC Managed Care – PPO | Admitting: Obstetrics & Gynecology

## 2014-08-06 VITALS — BP 123/75 | HR 79 | Temp 97.9°F | Wt 174.0 lb

## 2014-08-06 DIAGNOSIS — Z23 Encounter for immunization: Secondary | ICD-10-CM

## 2014-08-06 DIAGNOSIS — Z3482 Encounter for supervision of other normal pregnancy, second trimester: Secondary | ICD-10-CM

## 2014-08-07 DIAGNOSIS — Z23 Encounter for immunization: Secondary | ICD-10-CM

## 2014-08-07 LAB — POCT URINALYSIS DIPSTICK
Bilirubin, UA: NEGATIVE
Glucose, UA: NEGATIVE
Ketones, UA: NEGATIVE
Leukocytes, UA: NEGATIVE
Nitrite, UA: NEGATIVE
PH UA: 5.5
PROTEIN UA: NEGATIVE
RBC UA: NEGATIVE
Spec Grav, UA: 1.015
UROBILINOGEN UA: NEGATIVE

## 2014-08-07 LAB — IBC PANEL
%SAT: 4 % — ABNORMAL LOW (ref 20–55)
TIBC: 582 ug/dL — ABNORMAL HIGH (ref 250–470)
UIBC: 560 ug/dL — ABNORMAL HIGH (ref 125–400)

## 2014-08-07 LAB — IRON: IRON: 22 ug/dL — AB (ref 42–145)

## 2014-08-07 LAB — FERRITIN: Ferritin: <1 ng/mL — ABNORMAL LOW (ref 10–291)

## 2014-08-08 NOTE — Progress Notes (Signed)
Subjective:    Mary LevelsJalisa S Mora is a 23 y.o. female being seen today for her obstetrical visit. She is at 5337w5d gestation. Patient reports: no complaints . Fetal movement: normal.  Problem List Items Addressed This Visit     High   Supervision of other normal pregnancy - Primary   Relevant Orders      POCT urinalysis dipstick (Completed)      IBC panel (Completed)      Ferritin (Completed)    Other Visit Diagnoses   Need for immunization against influenza        Relevant Orders       Flu Vaccine QUAD 36+ mos IM (Fluarix) (Completed)      Patient Active Problem List   Diagnosis Date Noted  . Anemia 06/15/2014    Priority: High  . Supervision of other normal pregnancy 06/11/2014    Priority: High   Objective:    BP 123/75  Pulse 79  Temp(Src) 97.9 F (36.6 C)  Wt 78.926 kg (174 lb)  LMP 03/09/2014 FHT: 160 BPM  Uterine Size: size equals dates     Assessment:    Pregnancy @ 6989w3d    Plan:   Orders Placed This Encounter  Procedures  . Flu Vaccine QUAD 36+ mos IM (Fluarix)  . IBC panel  . Ferritin  . POCT urinalysis dipstick   Labs, problem list reviewed and updated 2 hr GTT planned Follow up in 4 weeks.

## 2014-08-08 NOTE — Patient Instructions (Signed)

## 2014-08-09 ENCOUNTER — Encounter: Payer: Self-pay | Admitting: Obstetrics & Gynecology

## 2014-08-25 ENCOUNTER — Encounter: Payer: Self-pay | Admitting: Obstetrics & Gynecology

## 2014-09-04 ENCOUNTER — Ambulatory Visit (INDEPENDENT_AMBULATORY_CARE_PROVIDER_SITE_OTHER): Payer: BC Managed Care – PPO | Admitting: Obstetrics & Gynecology

## 2014-09-04 ENCOUNTER — Encounter: Payer: Self-pay | Admitting: Obstetrics & Gynecology

## 2014-09-04 ENCOUNTER — Other Ambulatory Visit: Payer: BC Managed Care – PPO

## 2014-09-04 VITALS — BP 127/76 | HR 94 | Wt 178.0 lb

## 2014-09-04 DIAGNOSIS — Z3482 Encounter for supervision of other normal pregnancy, second trimester: Secondary | ICD-10-CM

## 2014-09-04 LAB — POCT URINALYSIS DIPSTICK
BILIRUBIN UA: NEGATIVE
Blood, UA: NEGATIVE
GLUCOSE UA: NEGATIVE
KETONES UA: NEGATIVE
LEUKOCYTES UA: NEGATIVE
Nitrite, UA: NEGATIVE
Protein, UA: NEGATIVE
SPEC GRAV UA: 1.015
Urobilinogen, UA: NEGATIVE
pH, UA: 6

## 2014-09-04 NOTE — Patient Instructions (Signed)
Second Trimester of Pregnancy The second trimester is from week 13 through week 28, months 4 through 6. The second trimester is often a time when you feel your best. Your body has also adjusted to being pregnant, and you begin to feel better physically. Usually, morning sickness has lessened or quit completely, you may have more energy, and you may have an increase in appetite. The second trimester is also a time when the fetus is growing rapidly. At the end of the sixth month, the fetus is about 9 inches long and weighs about 1 pounds. You will likely begin to feel the baby move (quickening) between 18 and 20 weeks of the pregnancy. BODY CHANGES Your body goes through many changes during pregnancy. The changes vary from woman to woman.   Your weight will continue to increase. You will notice your lower abdomen bulging out.  You may begin to get stretch marks on your hips, abdomen, and breasts.  You may develop headaches that can be relieved by medicines approved by your health care provider.  You may urinate more often because the fetus is pressing on your bladder.  You may develop or continue to have heartburn as a result of your pregnancy.  You may develop constipation because certain hormones are causing the muscles that push waste through your intestines to slow down.  You may develop hemorrhoids or swollen, bulging veins (varicose veins).  You may have back pain because of the weight gain and pregnancy hormones relaxing your joints between the bones in your pelvis and as a result of a shift in weight and the muscles that support your balance.  Your breasts will continue to grow and be tender.  Your gums may bleed and may be sensitive to brushing and flossing.  Dark spots or blotches (chloasma, mask of pregnancy) may develop on your face. This will likely fade after the baby is born.  A dark line from your belly button to the pubic area (linea nigra) may appear. This will likely fade  after the baby is born.  You may have changes in your hair. These can include thickening of your hair, rapid growth, and changes in texture. Some women also have hair loss during or after pregnancy, or hair that feels dry or thin. Your hair will most likely return to normal after your baby is born. WHAT TO EXPECT AT YOUR PRENATAL VISITS During a routine prenatal visit:  You will be weighed to make sure you and the fetus are growing normally.  Your blood pressure will be taken.  Your abdomen will be measured to track your baby's growth.  The fetal heartbeat will be listened to.  Any test results from the previous visit will be discussed. Your health care provider may ask you:  How you are feeling.  If you are feeling the baby move.  If you have had any abnormal symptoms, such as leaking fluid, bleeding, severe headaches, or abdominal cramping.  If you have any questions. Other tests that may be performed during your second trimester include:  Blood tests that check for:  Low iron levels (anemia).  Gestational diabetes (between 24 and 28 weeks).  Rh antibodies.  Urine tests to check for infections, diabetes, or protein in the urine.  An ultrasound to confirm the proper growth and development of the baby.  An amniocentesis to check for possible genetic problems.  Fetal screens for spina bifida and Down syndrome. HOME CARE INSTRUCTIONS   Avoid all smoking, herbs, alcohol, and unprescribed   drugs. These chemicals affect the formation and growth of the baby.  Follow your health care provider's instructions regarding medicine use. There are medicines that are either safe or unsafe to take during pregnancy.  Exercise only as directed by your health care provider. Experiencing uterine cramps is a good sign to stop exercising.  Continue to eat regular, healthy meals.  Wear a good support bra for breast tenderness.  Do not use hot tubs, steam rooms, or saunas.  Wear your  seat belt at all times when driving.  Avoid raw meat, uncooked cheese, cat litter boxes, and soil used by cats. These carry germs that can cause birth defects in the baby.  Take your prenatal vitamins.  Try taking a stool softener (if your health care provider approves) if you develop constipation. Eat more high-fiber foods, such as fresh vegetables or fruit and whole grains. Drink plenty of fluids to keep your urine clear or pale yellow.  Take warm sitz baths to soothe any pain or discomfort caused by hemorrhoids. Use hemorrhoid cream if your health care provider approves.  If you develop varicose veins, wear support hose. Elevate your feet for 15 minutes, 3-4 times a day. Limit salt in your diet.  Avoid heavy lifting, wear low heel shoes, and practice good posture.  Rest with your legs elevated if you have leg cramps or low back pain.  Visit your dentist if you have not gone yet during your pregnancy. Use a soft toothbrush to brush your teeth and be gentle when you floss.  A sexual relationship may be continued unless your health care provider directs you otherwise.  Continue to go to all your prenatal visits as directed by your health care provider. SEEK MEDICAL CARE IF:   You have dizziness.  You have mild pelvic cramps, pelvic pressure, or nagging pain in the abdominal area.  You have persistent nausea, vomiting, or diarrhea.  You have a bad smelling vaginal discharge.  You have pain with urination. SEEK IMMEDIATE MEDICAL CARE IF:   You have a fever.  You are leaking fluid from your vagina.  You have spotting or bleeding from your vagina.  You have severe abdominal cramping or pain.  You have rapid weight gain or loss.  You have shortness of breath with chest pain.  You notice sudden or extreme swelling of your face, hands, ankles, feet, or legs.  You have not felt your baby move in over an hour.  You have severe headaches that do not go away with  medicine.  You have vision changes. Document Released: 10/04/2001 Document Revised: 10/15/2013 Document Reviewed: 12/11/2012 Exit[employer] Patient Information 2015 [employer]. This information is not intended to replace advice given to you by your health care provider. Make sure you discuss any questions you have with your health care provider.  

## 2014-09-04 NOTE — Progress Notes (Signed)
Subjective:    Mary LevelsJalisa S Mora is a 23 y.o. female being seen today for her obstetrical visit. She is at 4334w4d  gestation. Patient reports: no complaints . Fetal movement: normal.  Problem List Items Addressed This Visit    Supervision of other normal pregnancy - Primary   Relevant Orders      POCT urinalysis dipstick (Completed)      Glucose Tolerance, 2 Hours w/1 Hour      CBC      HIV antibody      RPR     Patient Active Problem List   Diagnosis Date Noted  . Iron deficiency anemia 06/15/2014    Priority: High  . Supervision of other normal pregnancy 06/11/2014    Priority: High   Objective:    BP 127/76 mmHg  Pulse 94  Wt 80.74 kg (178 lb)  LMP 03/09/2014 FHT: 160 BPM  Uterine Size: size equals dates     Assessment:    Pregnancy @ 1434w4d    Plan:   Orders Placed This Encounter  Procedures  . Glucose Tolerance, 2 Hours w/1 Hour  . CBC  . HIV antibody  . RPR  . POCT urinalysis dipstick   Labs, problem list reviewed and updated Follow up in 4 weeks.

## 2014-09-05 LAB — RPR

## 2014-09-05 LAB — CBC
HCT: 26.8 % — ABNORMAL LOW (ref 36.0–46.0)
Hemoglobin: 7.8 g/dL — ABNORMAL LOW (ref 12.0–15.0)
MCH: 18.8 pg — ABNORMAL LOW (ref 26.0–34.0)
MCHC: 29.1 g/dL — ABNORMAL LOW (ref 30.0–36.0)
MCV: 64.4 fL — ABNORMAL LOW (ref 78.0–100.0)
PLATELETS: 246 10*3/uL (ref 150–400)
RBC: 4.16 MIL/uL (ref 3.87–5.11)
RDW: 19 % — AB (ref 11.5–15.5)
WBC: 8.5 10*3/uL (ref 4.0–10.5)

## 2014-09-05 LAB — HIV ANTIBODY (ROUTINE TESTING W REFLEX): HIV: NONREACTIVE

## 2014-09-05 LAB — GLUCOSE TOLERANCE, 2 HOURS W/ 1HR
GLUCOSE, 2 HOUR: 85 mg/dL (ref 70–139)
Glucose, 1 hour: 102 mg/dL (ref 70–170)
Glucose, Fasting: 72 mg/dL (ref 70–99)

## 2014-10-02 ENCOUNTER — Encounter: Payer: Self-pay | Admitting: Obstetrics & Gynecology

## 2014-10-02 ENCOUNTER — Ambulatory Visit (INDEPENDENT_AMBULATORY_CARE_PROVIDER_SITE_OTHER): Payer: BC Managed Care – PPO | Admitting: Obstetrics & Gynecology

## 2014-10-02 VITALS — BP 136/77 | HR 105 | Wt 178.0 lb

## 2014-10-02 DIAGNOSIS — Z3483 Encounter for supervision of other normal pregnancy, third trimester: Secondary | ICD-10-CM

## 2014-10-02 LAB — POCT URINALYSIS DIPSTICK
Bilirubin, UA: NEGATIVE
Blood, UA: NEGATIVE
Glucose, UA: NEGATIVE
Ketones, UA: NEGATIVE
LEUKOCYTES UA: NEGATIVE
Nitrite, UA: NEGATIVE
PH UA: 6
Protein, UA: NEGATIVE
Spec Grav, UA: <=1.005
UROBILINOGEN UA: NEGATIVE

## 2014-10-03 NOTE — Progress Notes (Signed)
Subjective:    Mary LevelsJalisa S Mora is a 23 y.o. female being seen today for her obstetrical visit. She is at 3756w4d gestation. Patient reports no complaints. Fetal movement: normal.  Problem List Items Addressed This Visit    Supervision of other normal pregnancy - Primary   Relevant Orders      POCT urinalysis dipstick (Completed)     Patient Active Problem List   Diagnosis Date Noted  . Iron deficiency anemia 06/15/2014    Priority: High  . Supervision of other normal pregnancy 06/11/2014    Priority: High   Objective:    BP 136/77 mmHg  Pulse 105  Wt 80.74 kg (178 lb)  LMP 03/09/2014 FHT:  160 BPM  Uterine Size: size equals dates  Presentation: unsure     Assessment:    Pregnancy @ 2956w4d weeks   Plan:     labs reviewed, problem list updated TDAP offered   Orders Placed This Encounter  Procedures  . POCT urinalysis dipstick    Follow up in 2 Weeks.

## 2014-10-03 NOTE — Patient Instructions (Signed)

## 2014-10-14 ENCOUNTER — Encounter: Payer: Self-pay | Admitting: Obstetrics

## 2014-10-14 ENCOUNTER — Ambulatory Visit (INDEPENDENT_AMBULATORY_CARE_PROVIDER_SITE_OTHER): Payer: BC Managed Care – PPO | Admitting: Obstetrics

## 2014-10-14 VITALS — BP 128/73 | HR 94 | Temp 98.3°F | Wt 174.0 lb

## 2014-10-14 DIAGNOSIS — Z3483 Encounter for supervision of other normal pregnancy, third trimester: Secondary | ICD-10-CM

## 2014-10-14 LAB — POCT URINALYSIS DIPSTICK
BILIRUBIN UA: NEGATIVE
Blood, UA: NEGATIVE
Clarity, UA: NEGATIVE
Glucose, UA: NEGATIVE
KETONES UA: NEGATIVE
LEUKOCYTES UA: NEGATIVE
NITRITE UA: NEGATIVE
PROTEIN UA: NEGATIVE
Spec Grav, UA: 1.01
Urobilinogen, UA: NEGATIVE
pH, UA: 7.5

## 2014-10-14 NOTE — Progress Notes (Signed)
Subjective:    [name] is a 23 y.o. female being seen today for her obstetrical visit. She is at 2148w2d gestation. Patient reports no complaints. Fetal movement: normal.  Problem List Items Addressed This Visit    Supervision of other normal pregnancy - Primary   Relevant Orders      POCT urinalysis dipstick (Completed)     Patient Active Problem List   Diagnosis Date Noted  . Iron deficiency anemia 06/15/2014  . Supervision of other normal pregnancy 06/11/2014   Objective:    BP 128/73 mmHg  Pulse 94  Temp(Src) 98.3 F (36.8 C)  Wt 174 lb (78.926 kg)  LMP 03/09/2014 FHT:  150 BPM  Uterine Size: size equals dates  Presentation: unsure     Assessment:    Pregnancy @ 8148w2d weeks   Plan:     labs reviewed, problem list updated Consent signed. GBS sent TDAP offered  Rhogam given for RH negative Pediatrician: discussed. Infant feeding: plans to breastfeed. Maternity leave: not discussed. Cigarette smoking: never smoked. Orders Placed This Encounter  Procedures  . POCT urinalysis dipstick   No orders of the defined types were placed in this encounter.   Follow up in 2 Weeks.

## 2014-10-20 ENCOUNTER — Encounter: Payer: Self-pay | Admitting: *Deleted

## 2014-10-21 ENCOUNTER — Encounter: Payer: Self-pay | Admitting: Obstetrics & Gynecology

## 2014-10-24 NOTE — L&D Delivery Note (Signed)
Delivery Note At 8:16 AM a viable female was delivered via Vaginal, Spontaneous Delivery (Presentation: Left Occiput Anterior).  APGAR: 9, 9; weight 6 lb 6.8 oz (2915 g).   Placenta status: Intact, Spontaneous.  Cord: 3 vessels with the following complications: None.  Cord pH: none  Anesthesia: Epidural  Episiotomy: None Lacerations: 1st degree;Vaginal Suture Repair: 2.0 chromic Est. Blood Loss (mL): 300  Mom to postpartum.  Baby to Couplet care / Skin to Skin.  Mary Mora A 12/03/2014, 10:01 AM

## 2014-10-28 ENCOUNTER — Ambulatory Visit (INDEPENDENT_AMBULATORY_CARE_PROVIDER_SITE_OTHER): Payer: BLUE CROSS/BLUE SHIELD | Admitting: Obstetrics

## 2014-10-28 VITALS — BP 128/71 | HR 100 | Temp 98.3°F | Wt 179.0 lb

## 2014-10-28 DIAGNOSIS — Z3483 Encounter for supervision of other normal pregnancy, third trimester: Secondary | ICD-10-CM

## 2014-10-28 LAB — POCT URINALYSIS DIPSTICK
BILIRUBIN UA: NEGATIVE
Blood, UA: NEGATIVE
GLUCOSE UA: NEGATIVE
LEUKOCYTES UA: NEGATIVE
Nitrite, UA: NEGATIVE
Protein, UA: NEGATIVE
SPEC GRAV UA: 1.015
Urobilinogen, UA: NEGATIVE
pH, UA: 5

## 2014-10-29 ENCOUNTER — Encounter: Payer: Self-pay | Admitting: Obstetrics

## 2014-10-29 NOTE — Progress Notes (Signed)
Subjective:    [name] is a 24 y.o. female being seen today for her obstetrical visit. She is at 4560w3d gestation. Patient reports no complaints. Fetal movement: normal.  Problem List Items Addressed This Visit    Supervision of other normal pregnancy - Primary   Relevant Orders      POCT urinalysis dipstick (Completed)     Patient Active Problem List   Diagnosis Date Noted  . Iron deficiency anemia 06/15/2014  . Supervision of other normal pregnancy 06/11/2014   Objective:    BP 128/71 mmHg  Pulse 100  Temp(Src) 98.3 F (36.8 C)  Wt 179 lb (81.194 kg)  LMP 03/09/2014 FHT:  160 BPM  Uterine Size: size equals dates  Presentation: unsure     Assessment:    Pregnancy @ 6460w3d weeks   Plan:     labs reviewed, problem list updated Consent signed. GBS sent TDAP offered  Rhogam given for RH negative Pediatrician: discussed. Infant feeding: plans to breastfeed. Maternity leave: discussed. Cigarette smoking: never smoked. Orders Placed This Encounter  Procedures  . POCT urinalysis dipstick   No orders of the defined types were placed in this encounter.   Follow up in 2 Weeks.

## 2014-11-13 ENCOUNTER — Ambulatory Visit (INDEPENDENT_AMBULATORY_CARE_PROVIDER_SITE_OTHER): Payer: BLUE CROSS/BLUE SHIELD | Admitting: Obstetrics

## 2014-11-13 VITALS — BP 119/74 | HR 96 | Temp 98.7°F | Wt 181.0 lb

## 2014-11-13 DIAGNOSIS — Z3483 Encounter for supervision of other normal pregnancy, third trimester: Secondary | ICD-10-CM

## 2014-11-13 LAB — POCT URINALYSIS DIPSTICK
Bilirubin, UA: NEGATIVE
Blood, UA: NEGATIVE
Glucose, UA: 50
Ketones, UA: NEGATIVE
LEUKOCYTES UA: NEGATIVE
NITRITE UA: NEGATIVE
PH UA: 6
SPEC GRAV UA: 1.02
Urobilinogen, UA: NEGATIVE

## 2014-11-14 ENCOUNTER — Encounter: Payer: Self-pay | Admitting: Obstetrics

## 2014-11-14 NOTE — Progress Notes (Signed)
Subjective:    Mary Mora is a 24 y.o. female being seen today for her obstetrical visit. She is at 6549w5d gestation. Patient reports no complaints. Fetal movement: normal.  Problem List Items Addressed This Visit    Supervision of other normal pregnancy - Primary   Relevant Orders   Strep B DNA probe   POCT urinalysis dipstick (Completed)     Patient Active Problem List   Diagnosis Date Noted  . Iron deficiency anemia 06/15/2014  . Supervision of other normal pregnancy 06/11/2014   Objective:    BP 119/74 mmHg  Pulse 96  Temp(Src) 98.7 F (37.1 C)  Wt 181 lb (82.101 kg)  LMP 03/09/2014 FHT:  160 BPM  Uterine Size: size equals dates  Presentation: unsure     Assessment:    Pregnancy @ 9049w5d weeks   Plan:     labs reviewed, problem list updated Consent signed. GBS sent TDAP offered  Rhogam given for RH negative Pediatrician: discussed. Infant feeding: plans to breastfeed. Maternity leave: discussed. Cigarette smoking: never smoked. Orders Placed This Encounter  Procedures  . Strep B DNA probe  . POCT urinalysis dipstick   No orders of the defined types were placed in this encounter.   Follow up in 1 Week.

## 2014-11-20 ENCOUNTER — Ambulatory Visit (INDEPENDENT_AMBULATORY_CARE_PROVIDER_SITE_OTHER): Payer: BLUE CROSS/BLUE SHIELD | Admitting: Obstetrics

## 2014-11-20 VITALS — BP 119/74 | HR 105 | Temp 97.3°F | Wt 178.0 lb

## 2014-11-20 DIAGNOSIS — Z3483 Encounter for supervision of other normal pregnancy, third trimester: Secondary | ICD-10-CM

## 2014-11-20 LAB — POCT URINALYSIS DIPSTICK
BILIRUBIN UA: NEGATIVE
Blood, UA: NEGATIVE
GLUCOSE UA: 50
KETONES UA: NEGATIVE
NITRITE UA: NEGATIVE
Spec Grav, UA: 1.02
UROBILINOGEN UA: 4
pH, UA: 5

## 2014-11-21 ENCOUNTER — Encounter: Payer: Self-pay | Admitting: Obstetrics

## 2014-11-21 NOTE — Progress Notes (Signed)
Subjective:    [name] is a 24 y.o. female being seen today for her obstetrical visit. She is at 1955w5d gestation. Patient reports no complaints. Fetal movement: normal.  Problem List Items Addressed This Visit    Supervision of other normal pregnancy - Primary   Relevant Orders   POCT urinalysis dipstick (Completed)   Strep B DNA probe     Patient Active Problem List   Diagnosis Date Noted  . Iron deficiency anemia 06/15/2014  . Supervision of other normal pregnancy 06/11/2014   Objective:    BP 119/74 mmHg  Pulse 105  Temp(Src) 97.3 F (36.3 C)  Wt 178 lb (80.74 kg)  LMP 03/09/2014 FHT:  160 BPM  Uterine Size: size equals dates  Presentation: unsure     Assessment:    Pregnancy @ 6355w5d weeks   Plan:     labs reviewed, problem list updated Consent signed. GBS sent TDAP offered  Rhogam given for RH negative Pediatrician: discussed. Infant feeding: plans to breastfeed. Maternity leave: discussed. Cigarette smoking: never smoked. Orders Placed This Encounter  Procedures  . Strep B DNA probe  . POCT urinalysis dipstick   No orders of the defined types were placed in this encounter.   Follow up in 1 Week.

## 2014-11-22 LAB — STREP B DNA PROBE: GBSP: NOT DETECTED

## 2014-12-01 ENCOUNTER — Encounter: Payer: Self-pay | Admitting: Obstetrics

## 2014-12-01 ENCOUNTER — Ambulatory Visit (INDEPENDENT_AMBULATORY_CARE_PROVIDER_SITE_OTHER): Payer: BLUE CROSS/BLUE SHIELD | Admitting: Obstetrics

## 2014-12-01 ENCOUNTER — Encounter: Payer: Self-pay | Admitting: *Deleted

## 2014-12-01 VITALS — BP 131/83 | HR 98 | Temp 97.7°F | Wt 184.0 lb

## 2014-12-01 DIAGNOSIS — Z3483 Encounter for supervision of other normal pregnancy, third trimester: Secondary | ICD-10-CM

## 2014-12-01 LAB — POCT URINALYSIS DIPSTICK
BILIRUBIN UA: NEGATIVE
Glucose, UA: NEGATIVE
Ketones, UA: NEGATIVE
Nitrite, UA: NEGATIVE
PROTEIN UA: NEGATIVE
RBC UA: NEGATIVE
Spec Grav, UA: 1.015
Urobilinogen, UA: NEGATIVE
pH, UA: 7

## 2014-12-01 NOTE — Progress Notes (Signed)
Subjective:    Mary LevelsJalisa S Mora is a 24 y.o. female being seen today for her obstetrical visit. She is at 4069w1d gestation. Patient reports no complaints. Fetal movement: normal.  Problem List Items Addressed This Visit    Supervision of other normal pregnancy - Primary   Relevant Orders   POCT urinalysis dipstick (Completed)     Patient Active Problem List   Diagnosis Date Noted  . Iron deficiency anemia 06/15/2014  . Supervision of other normal pregnancy 06/11/2014    Objective:    BP 131/83 mmHg  Pulse 98  Temp(Src) 97.7 F (36.5 C)  Wt 184 lb (83.462 kg)  LMP 03/09/2014 FHT: 160 BPM  Uterine Size: size equals dates  Presentations: unsure  Pelvic Exam: Deferred    Assessment:    Pregnancy @ 5069w1d weeks   Plan:   Plans for delivery: Vaginal anticipated; labs reviewed; problem list updated Counseling: Consent signed. Infant feeding: plans to breastfeed. Cigarette smoking: never smoked. L&D discussion: symptoms of labor, discussed when to call, discussed what number to call, anesthetic/analgesic options reviewed and delivering clinician:  plans Physician. Postpartum supports and preparation: circumcision discussed and contraception plans discussed.  Follow up in 1 Week.

## 2014-12-03 ENCOUNTER — Inpatient Hospital Stay (HOSPITAL_COMMUNITY): Payer: BLUE CROSS/BLUE SHIELD | Admitting: Anesthesiology

## 2014-12-03 ENCOUNTER — Inpatient Hospital Stay (HOSPITAL_COMMUNITY)
Admission: AD | Admit: 2014-12-03 | Discharge: 2014-12-05 | DRG: 775 | Disposition: A | Discharge status: Home / Self Care | Payer: BLUE CROSS/BLUE SHIELD | Source: Ambulatory Visit | Attending: Obstetrics | Admitting: Obstetrics

## 2014-12-03 ENCOUNTER — Encounter (HOSPITAL_COMMUNITY): Payer: Self-pay | Admitting: *Deleted

## 2014-12-03 DIAGNOSIS — O9902 Anemia complicating childbirth: Secondary | ICD-10-CM | POA: Diagnosis present

## 2014-12-03 DIAGNOSIS — D509 Iron deficiency anemia, unspecified: Secondary | ICD-10-CM | POA: Diagnosis present

## 2014-12-03 DIAGNOSIS — Z3A38 38 weeks gestation of pregnancy: Secondary | ICD-10-CM | POA: Diagnosis present

## 2014-12-03 DIAGNOSIS — N858 Other specified noninflammatory disorders of uterus: Secondary | ICD-10-CM | POA: Diagnosis present

## 2014-12-03 LAB — CBC
HEMATOCRIT: 24.5 % — AB (ref 36.0–46.0)
Hemoglobin: 7.4 g/dL — ABNORMAL LOW (ref 12.0–15.0)
MCH: 18.8 pg — AB (ref 26.0–34.0)
MCHC: 30.2 g/dL (ref 30.0–36.0)
MCV: 62.3 fL — AB (ref 78.0–100.0)
PLATELETS: 291 10*3/uL (ref 150–400)
RBC: 3.93 MIL/uL (ref 3.87–5.11)
RDW: 19.4 % — AB (ref 11.5–15.5)
WBC: 9.7 10*3/uL (ref 4.0–10.5)

## 2014-12-03 LAB — TYPE AND SCREEN
ABO/RH(D): O POS
Antibody Screen: NEGATIVE

## 2014-12-03 LAB — ABO/RH: ABO/RH(D): O POS

## 2014-12-03 MED ORDER — SIMETHICONE 80 MG PO CHEW: 80.0000 mg | CHEWABLE_TABLET | ORAL | Status: DC | PRN

## 2014-12-03 MED ORDER — WITCH HAZEL-GLYCERIN EX PADS
1.0000 | MEDICATED_PAD | CUTANEOUS | Status: DC | PRN
Start: 2014-12-03 — End: 2014-12-05

## 2014-12-03 MED ORDER — LACTATED RINGERS IV SOLN: 500.0000 mL | Freq: Once | INTRAVENOUS | Status: DC

## 2014-12-03 MED ORDER — OXYCODONE-ACETAMINOPHEN 5-325 MG PO TABS: 2.0000 | ORAL_TABLET | ORAL | Status: DC | PRN

## 2014-12-03 MED ORDER — PRENATAL MULTIVITAMIN CH
1.0000 | ORAL_TABLET | Freq: Every day | ORAL | Status: DC
  Administered 2014-12-03 – 2014-12-04 (×2): 1 via ORAL
  Filled 2014-12-03 (×2): qty 1

## 2014-12-03 MED ORDER — FENTANYL 2.5 MCG/ML BUPIVACAINE 1/10 % EPIDURAL INFUSION (WH - ANES)
14.0000 mL/h | INTRAMUSCULAR | Status: DC | PRN
  Administered 2014-12-03: 14 mL/h via EPIDURAL
  Filled 2014-12-03: qty 125

## 2014-12-03 MED ORDER — OXYTOCIN 40 UNITS IN LACTATED RINGERS INFUSION - SIMPLE MED
62.5000 mL/h | INTRAVENOUS | Status: DC
  Filled 2014-12-03: qty 1000

## 2014-12-03 MED ORDER — PHENYLEPHRINE 40 MCG/ML (10ML) SYRINGE FOR IV PUSH (FOR BLOOD PRESSURE SUPPORT)
80.0000 ug | PREFILLED_SYRINGE | INTRAVENOUS | Status: DC | PRN
  Filled 2014-12-03: qty 2

## 2014-12-03 MED ORDER — OXYTOCIN 40 UNITS IN LACTATED RINGERS INFUSION - SIMPLE MED: 62.5000 mL/h | INTRAVENOUS | Status: DC | PRN

## 2014-12-03 MED ORDER — LIDOCAINE HCL (PF) 1 % IJ SOLN
INTRAMUSCULAR | Status: DC | PRN
  Administered 2014-12-03 (×2): 4 mL

## 2014-12-03 MED ORDER — DIPHENHYDRAMINE HCL 25 MG PO CAPS: 25.0000 mg | ORAL_CAPSULE | Freq: Four times a day (QID) | ORAL | Status: DC | PRN

## 2014-12-03 MED ORDER — EPHEDRINE 5 MG/ML INJ
10.0000 mg | INTRAVENOUS | Status: DC | PRN
  Filled 2014-12-03: qty 2

## 2014-12-03 MED ORDER — LACTATED RINGERS IV SOLN
INTRAVENOUS | Status: DC
  Administered 2014-12-03 (×2): via INTRAVENOUS

## 2014-12-03 MED ORDER — ONDANSETRON HCL 4 MG/2ML IJ SOLN: 4.0000 mg | Freq: Four times a day (QID) | INTRAMUSCULAR | Status: DC | PRN

## 2014-12-03 MED ORDER — FLEET ENEMA 7-19 GM/118ML RE ENEM: 1.0000 | ENEMA | RECTAL | Status: DC | PRN

## 2014-12-03 MED ORDER — ACETAMINOPHEN 325 MG PO TABS: 650.0000 mg | ORAL_TABLET | ORAL | Status: DC | PRN

## 2014-12-03 MED ORDER — BENZOCAINE-MENTHOL 20-0.5 % EX AERO
1.0000 "application " | INHALATION_SPRAY | CUTANEOUS | Status: DC | PRN
  Administered 2014-12-03: 1 via TOPICAL
  Filled 2014-12-03: qty 56

## 2014-12-03 MED ORDER — OXYTOCIN BOLUS FROM INFUSION: 500.0000 mL | INTRAVENOUS | Status: DC

## 2014-12-03 MED ORDER — SENNOSIDES-DOCUSATE SODIUM 8.6-50 MG PO TABS
2.0000 | ORAL_TABLET | ORAL | Status: DC
  Administered 2014-12-03 – 2014-12-05 (×2): 2 via ORAL
  Filled 2014-12-03 (×2): qty 2

## 2014-12-03 MED ORDER — LACTATED RINGERS IV SOLN: 500.0000 mL | INTRAVENOUS | Status: DC | PRN

## 2014-12-03 MED ORDER — LANOLIN HYDROUS EX OINT: TOPICAL_OINTMENT | CUTANEOUS | Status: DC | PRN

## 2014-12-03 MED ORDER — FENTANYL 2.5 MCG/ML BUPIVACAINE 1/10 % EPIDURAL INFUSION (WH - ANES)
INTRAMUSCULAR | Status: DC | PRN
  Administered 2014-12-03: 14 mL/h via EPIDURAL

## 2014-12-03 MED ORDER — ONDANSETRON HCL 4 MG PO TABS: 4.0000 mg | ORAL_TABLET | ORAL | Status: DC | PRN

## 2014-12-03 MED ORDER — IBUPROFEN 600 MG PO TABS
600.0000 mg | ORAL_TABLET | Freq: Four times a day (QID) | ORAL | Status: DC
  Administered 2014-12-03 – 2014-12-05 (×8): 600 mg via ORAL
  Filled 2014-12-03 (×8): qty 1

## 2014-12-03 MED ORDER — CITRIC ACID-SODIUM CITRATE 334-500 MG/5ML PO SOLN: 30.0000 mL | ORAL | Status: DC | PRN

## 2014-12-03 MED ORDER — POLYSACCHARIDE IRON COMPLEX 150 MG PO CAPS
150.0000 mg | ORAL_CAPSULE | Freq: Every day | ORAL | Status: DC
  Administered 2014-12-03 – 2014-12-05 (×3): 150 mg via ORAL
  Filled 2014-12-03 (×3): qty 1

## 2014-12-03 MED ORDER — DIBUCAINE 1 % RE OINT: 1.0000 "application " | TOPICAL_OINTMENT | RECTAL | Status: DC | PRN

## 2014-12-03 MED ORDER — OXYCODONE-ACETAMINOPHEN 5-325 MG PO TABS: 1.0000 | ORAL_TABLET | ORAL | Status: DC | PRN

## 2014-12-03 MED ORDER — ONDANSETRON HCL 4 MG/2ML IJ SOLN: 4.0000 mg | INTRAMUSCULAR | Status: DC | PRN

## 2014-12-03 MED ORDER — PHENYLEPHRINE 40 MCG/ML (10ML) SYRINGE FOR IV PUSH (FOR BLOOD PRESSURE SUPPORT)
80.0000 ug | PREFILLED_SYRINGE | INTRAVENOUS | Status: DC | PRN
  Filled 2014-12-03: qty 2
  Filled 2014-12-03: qty 20

## 2014-12-03 MED ORDER — ZOLPIDEM TARTRATE 5 MG PO TABS
5.0000 mg | ORAL_TABLET | Freq: Every evening | ORAL | Status: DC | PRN
Start: 2014-12-03 — End: 2014-12-05

## 2014-12-03 MED ORDER — TETANUS-DIPHTH-ACELL PERTUSSIS 5-2.5-18.5 LF-MCG/0.5 IM SUSP
0.5000 mL | Freq: Once | INTRAMUSCULAR | Status: DC
  Filled 2014-12-03: qty 0.5

## 2014-12-03 MED ORDER — DIPHENHYDRAMINE HCL 50 MG/ML IJ SOLN: 12.5000 mg | INTRAMUSCULAR | Status: DC | PRN

## 2014-12-03 MED ORDER — LIDOCAINE HCL (PF) 1 % IJ SOLN
30.0000 mL | INTRAMUSCULAR | Status: DC | PRN
  Filled 2014-12-03: qty 30

## 2014-12-03 NOTE — H&P (Signed)
Mary Mora is a 24 y.o. female presenting for UC's. Maternal Medical History:  Reason for admission: Contractions.   Contractions: Onset was 6-12 hours ago.   Frequency: regular.   Perceived severity is moderate.    Fetal activity: Perceived fetal activity is normal.   Last perceived fetal movement was within the past hour.    Prenatal complications: no prenatal complications Prenatal Complications - Diabetes: none.    OB History    Gravida Para Term Preterm AB TAB SAB Ectopic Multiple Living   3 1 1  1 1    1      Past Medical History  Diagnosis Date  . No pertinent past medical history   . Infection   . BV (bacterial vaginosis)   . Heart burn    Past Surgical History  Procedure Laterality Date  . No past surgeries     Family History: family history includes Diabetes in her maternal grandmother; Hypertension in her mother; Kidney disease in her maternal grandmother. Social History:  reports that she has never smoked. She has never used smokeless tobacco. She reports that she does not drink alcohol or use illicit drugs.   Prenatal Transfer Tool  Maternal Diabetes: No Genetic Screening: Normal Maternal Ultrasounds/Referrals: Normal Fetal Ultrasounds or other Referrals:  None Maternal Substance Abuse:  No Significant Maternal Medications:  None Significant Maternal Lab Results:  None Other Comments:  None  Review of Systems  All other systems reviewed and are negative.   Dilation: 4 Effacement (%): 100 Station: -2 Exam by:: Mary Mora Blood pressure 129/69, pulse 95, temperature 98.1 F (36.7 C), temperature source Oral, resp. rate 18, height 5\' 1"  (1.549 m), weight 180 lb (81.647 kg), last menstrual period 03/09/2014, unknown if currently breastfeeding. Maternal Exam:  Uterine Assessment: Contraction strength is moderate.  Abdomen: Patient reports no abdominal tenderness. Fetal presentation: vertex  Pelvis: adequate for delivery.   Cervix:  Cervix evaluated by digital exam.     Physical Exam  Nursing note and vitals reviewed. Constitutional: She is oriented to person, place, and time. She appears well-developed and well-nourished.  HENT:  Head: Normocephalic and atraumatic.  Eyes: Conjunctivae are normal. Pupils are equal, round, and reactive to light.  Neck: Normal range of motion. Neck supple.  Cardiovascular: Normal rate and regular rhythm.   Respiratory: Effort normal and breath sounds normal.  GI: Soft.  Genitourinary: Vagina normal and uterus normal.  Musculoskeletal: Normal range of motion.  Neurological: She is alert and oriented to person, place, and time.  Skin: Skin is warm and dry.  Psychiatric: She has a normal mood and affect. Her behavior is normal. Judgment and thought content normal.    Prenatal labs: ABO, Rh: --/--/O POS (02/10 0455) Antibody: NEG (02/10 0455) Rubella: 1.67 (08/19 1719) RPR: NON REAC (11/12 1000)  HBsAg: NEGATIVE (08/19 1719)  HIV: NONREACTIVE (11/12 1000)  GBS: NOT DETECTED (01/28 1433)   Assessment/Plan: 38 weeks.  Active labor.  Admit.   Mary Mora A 12/03/2014, 5:55 AM

## 2014-12-03 NOTE — Anesthesia Preprocedure Evaluation (Signed)
Anesthesia Evaluation  Patient identified by MRN, date of birth, ID band Patient awake    Reviewed: Allergy & Precautions, H&P , Patient's Chart, lab work & pertinent test results  Airway Mallampati: III  TM Distance: >3 FB Neck ROM: full    Dental no notable dental hx. (+) Teeth Intact   Pulmonary neg pulmonary ROS,  breath sounds clear to auscultation  Pulmonary exam normal       Cardiovascular negative cardio ROS  Rhythm:regular Rate:Normal     Neuro/Psych negative neurological ROS  negative psych ROS   GI/Hepatic Neg liver ROS, GERD-  Medicated and Controlled,  Endo/Other  Obesity  Renal/GU negative Renal ROS  negative genitourinary   Musculoskeletal   Abdominal Normal abdominal exam  (+)   Peds  Hematology negative hematology ROS (+) anemia ,   Anesthesia Other Findings   Reproductive/Obstetrics (+) Pregnancy                             Anesthesia Physical Anesthesia Plan  ASA: II  Anesthesia Plan: Epidural   Post-op Pain Management:    Induction:   Airway Management Planned:   Additional Equipment:   Intra-op Plan:   Post-operative Plan:   Informed Consent: I have reviewed the patients History and Physical, chart, labs and discussed the procedure including the risks, benefits and alternatives for the proposed anesthesia with the patient or authorized representative who has indicated his/her understanding and acceptance.     Plan Discussed with: Anesthesiologist  Anesthesia Plan Comments:         Anesthesia Quick Evaluation  

## 2014-12-03 NOTE — MAU Note (Signed)
Pt states that contractions began about 4 hours ago. Denies leaking of fluid and bleeding and states that baby is active.

## 2014-12-03 NOTE — Anesthesia Postprocedure Evaluation (Signed)
  Anesthesia Post-op Note  Patient: Mary Mora  Procedure(s) Performed: * No procedures listed *  Patient Location: Mother/Baby  Anesthesia Type:Epidural  Level of Consciousness: awake, alert , oriented and patient cooperative  Airway and Oxygen Therapy: Patient Spontanous Breathing  Post-op Pain: mild  Post-op Assessment: Patient's Cardiovascular Status Stable, Respiratory Function Stable, No headache, No backache, No residual numbness and No residual motor weakness  Post-op Vital Signs: stable  Last Vitals:  Filed Vitals:   12/03/14 1115  BP: 130/64  Pulse: 85  Temp: 36.7 C  Resp: 20    Complications: No apparent anesthesia complications

## 2014-12-03 NOTE — Anesthesia Procedure Notes (Signed)
Epidural Patient location during procedure: OB Start time: 12/03/2014 7:07 AM  Staffing Anesthesiologist: Grenda Lora A. Performed by: anesthesiologist   Preanesthetic Checklist Completed: patient identified, site marked, surgical consent, pre-op evaluation, timeout performed, IV checked, risks and benefits discussed and monitors and equipment checked  Epidural Patient position: sitting Prep: site prepped and draped and DuraPrep Patient monitoring: continuous pulse ox and blood pressure Approach: midline Location: L3-L4 Injection technique: LOR air  Needle:  Needle type: Tuohy  Needle gauge: 17 G Needle length: 9 cm and 9 Needle insertion depth: 5 cm cm Catheter type: closed end flexible Catheter size: 19 Gauge Catheter at skin depth: 10 cm Test dose: negative and Other  Assessment Events: blood not aspirated, injection not painful, no injection resistance, negative IV test and no paresthesia  Additional Notes Patient identified. Risks and benefits discussed including failed block, incomplete  Pain control, post dural puncture headache, nerve damage, paralysis, blood pressure Changes, nausea, vomiting, reactions to medications-both toxic and allergic and post Partum back pain. All questions were answered. Patient expressed understanding and wished to proceed. Sterile technique was used throughout procedure. Epidural site was Dressed with sterile barrier dressing. No paresthesias, signs of intravascular injection Or signs of intrathecal spread were encountered.  Patient was more comfortable after the epidural was dosed. Please see RN's note for documentation of vital signs and FHR which are stable.

## 2014-12-03 NOTE — Lactation Note (Signed)
This note was copied from the chart of Boy Janey GreaserJalisa Mora. Lactation Consultation Note  Patient Name: [name] EXBMW'UToday's Date: 12/03/2014 Reason for consult: Initial assessment of this mom and baby at 12 hours pp.  Mom did not breastfeed her first child, now 89 yo.  Mom reports that newborn latched well after birth but has been sleepy since.  Baby having first bath at this time.  LC encouraged frequent STS and cue feedings ad lib.  Mom says she had not yet been shown hand expression so she will ask her nurse to demonstrate at next feeding.  Nursery RN has documented showing hand expression earlier today. Mom encouraged to feed baby 8-12 times/24 hours and with feeding cues. LC encouraged review of Baby and Me pp 9, 14 and 20-25 for STS and BF information. LC provided [employer] Resource brochure and reviewed [facility] and list of community and web site resources.     Maternal Data Formula Feeding for Exclusion: No Has patient been taught Hand Expression?: Yes (as documented by RN (needs reinforcement)) Does the patient have breastfeeding experience prior to this delivery?: No  Feeding Feeding Type: Breast Milk Length of feed: 1 min  LATCH Score/Interventions         Initial LATCH score=7 per RN assessment             Lactation Tools Discussed/Used   STS, cue feeding, hand expression (nurse to demonstrate at next feeding)  Consult Status Consult Status: Follow-up Date: 12/04/14 Follow-up type: In-patient    Warrick ParisianBryant, Glory Graefe [facility] 12/03/2014, 9:27 PM

## 2014-12-04 LAB — CBC
HEMATOCRIT: 23.5 % — AB (ref 36.0–46.0)
HEMOGLOBIN: 7 g/dL — AB (ref 12.0–15.0)
MCH: 18.6 pg — ABNORMAL LOW (ref 26.0–34.0)
MCHC: 29.8 g/dL — ABNORMAL LOW (ref 30.0–36.0)
MCV: 62.5 fL — ABNORMAL LOW (ref 78.0–100.0)
PLATELETS: 243 10*3/uL (ref 150–400)
RBC: 3.76 MIL/uL — ABNORMAL LOW (ref 3.87–5.11)
RDW: 19.3 % — ABNORMAL HIGH (ref 11.5–15.5)
WBC: 10.3 10*3/uL (ref 4.0–10.5)

## 2014-12-04 LAB — RPR: RPR Ser Ql: NONREACTIVE

## 2014-12-04 NOTE — Progress Notes (Signed)
Post Partum Day 1 Subjective: no complaints  Objective: Blood pressure 127/66, pulse 83, temperature 98 F (36.7 C), temperature source Oral, resp. rate 20, height 5\' 1"  (1.549 m), weight 180 lb (81.647 kg), last menstrual period 03/09/2014, SpO2 100 %, unknown if currently breastfeeding.  Physical Exam:  General: alert and no distress Lochia: appropriate Uterine Fundus: firm Incision: none DVT Evaluation: No evidence of DVT seen on physical exam.   Recent Labs  12/03/14 0455 12/04/14 0545  HGB 7.4* 7.0*  HCT 24.5* 23.5*    Assessment/Plan: Anemia.  Chronic iron deficiency.  Stable.  Iron started. Plan for discharge tomorrow   LOS: 1 day   HARPER,CHARLES A 12/04/2014, 6:39 AM

## 2014-12-04 NOTE — Lactation Note (Signed)
This note was copied from the chart of Mary Mora Kenneth. Lactation Consultation Note  Mom reports that BF is going well and that she BF about an hour ago.  A LATCH score has not been done in the past 30 hours so I asked her to call for observation at the next feeding and every 8 hours after that.  She agreed that she would.  Hand expression was taught with colostrum easily expressed.  I noted that her breasts were somewhat widely spaced and tubular.  She reports that her breasts did not change much during pregnancy but that her areolas became bigger.  She reports that since delivery she was beginning to feel changes.  Patient Name: Mary Mora Enloe VHQIO'NToday's Date: 12/04/2014     Maternal Data    Feeding Feeding Type: Breast Fed  LATCH Score/Interventions                      Lactation Tools Discussed/Used     Consult Status      Soyla DryerJoseph, Chalmers Iddings 12/04/2014, 3:23 PM

## 2014-12-05 MED ORDER — FUSION PLUS PO CAPS: 1.0000 | ORAL_CAPSULE | Freq: Every day | ORAL | Status: DC

## 2014-12-05 MED ORDER — OXYCODONE-ACETAMINOPHEN 5-325 MG PO TABS: 1.0000 | ORAL_TABLET | ORAL | Status: DC | PRN

## 2014-12-05 MED ORDER — IBUPROFEN 600 MG PO TABS: 600.0000 mg | ORAL_TABLET | Freq: Four times a day (QID) | ORAL | Status: DC | PRN

## 2014-12-05 NOTE — Progress Notes (Signed)
Patient declines Tdap vaccine. 

## 2014-12-05 NOTE — Lactation Note (Signed)
This note was copied from the chart of Mary Janey GreaserJalisa Buehler. Lactation Consultation Note  Mother's nipples tender, no cracks.  Provided her w/ comfort gels and a hand pump. Discussed how to achieve a deeper latch. Mom encouraged to feed baby 8-12 times/24 hours and with feeding cues.  Reviewed engorgement care, mastitis symptoms, and monitoring voids/stools. Encouraged mother to call if she wants help w/ latch.  Patient Name: Mary Mora ZOXWR'UToday's Date: 12/05/2014 Reason for consult: Follow-up assessment   Maternal Data    Feeding    LATCH Score/Interventions                      Lactation Tools Discussed/Used     Consult Status Consult Status: Complete    Hardie PulleyBerkelhammer, Kylie Gros Boschen 12/05/2014, 10:18 AM

## 2014-12-05 NOTE — Discharge Summary (Signed)
Obstetric Discharge Summary Reason for Admission: onset of labor Prenatal Procedures: ultrasound Intrapartum Procedures: spontaneous vaginal delivery Postpartum Procedures: none Complications-Operative and Postpartum: none HEMOGLOBIN  Date Value Ref Range Status  12/04/2014 7.0* 12.0 - 15.0 g/dL Final   HCT  Date Value Ref Range Status  12/04/2014 23.5* 36.0 - 46.0 % Final    Physical Exam:  General: alert and no distress Lochia: appropriate Uterine Fundus: firm Incision: none DVT Evaluation: No evidence of DVT seen on physical exam.  Discharge Diagnoses: Term Pregnancy-delivered  Discharge Information: Date: 12/05/2014 Activity: pelvic rest Diet: routine Medications: PNV, Ibuprofen, Colace, Iron and Percocet Condition: stable Instructions: refer to practice specific booklet Discharge to: home Follow-up Information    Follow up with HARPER,CHARLES A, MD. Schedule an appointment as soon as possible for a visit in 2 weeks.   Specialty:  Obstetrics and Gynecology   Contact information:   [address] [place] [address] [phone]       Newborn Data: Live born female  Birth Weight: 6 lb 6.8 oz (2915 g) APGAR: 9, 9  Home with mother.  HARPER,CHARLES A 12/05/2014, 8:46 AM

## 2014-12-05 NOTE — Progress Notes (Signed)
Post Partum Day 2 Subjective: no complaints  Objective: Blood pressure 109/58, pulse 70, temperature 97.9 F (36.6 C), temperature source Oral, resp. rate 18, height 5\' 1"  (1.549 m), weight 180 lb (81.647 kg), last menstrual period 03/09/2014, SpO2 100 %, unknown if currently breastfeeding.  Physical Exam:  General: alert and no distress Lochia: appropriate Uterine Fundus: firm Incision: none DVT Evaluation: No evidence of DVT seen on physical exam.   Recent Labs  12/03/14 0455 12/04/14 0545  HGB 7.4* 7.0*  HCT 24.5* 23.5*    Assessment/Plan: Anemia.  Chronic iron deficiency.  Stable.  Iron Rx. Discharge home   LOS: 2 days   HARPER,CHARLES A 12/05/2014, 8:38 AM

## 2014-12-08 ENCOUNTER — Encounter: Payer: BLUE CROSS/BLUE SHIELD | Admitting: Obstetrics

## 2014-12-15 ENCOUNTER — Encounter: Payer: BLUE CROSS/BLUE SHIELD | Admitting: Obstetrics

## 2014-12-22 ENCOUNTER — Ambulatory Visit (INDEPENDENT_AMBULATORY_CARE_PROVIDER_SITE_OTHER): Payer: BLUE CROSS/BLUE SHIELD | Admitting: Obstetrics

## 2014-12-22 ENCOUNTER — Encounter: Payer: Self-pay | Admitting: Obstetrics

## 2014-12-22 DIAGNOSIS — Z30014 Encounter for initial prescription of intrauterine contraceptive device: Secondary | ICD-10-CM

## 2014-12-22 NOTE — Progress Notes (Signed)
Subjective:     Mary LevelsJalisa S Mora is a 24 y.o. female who presents for a postpartum visit. She is 2 weeks postpartum following a spontaneous vaginal delivery. I have fully reviewed the prenatal and intrapartum course. The delivery was at 38 gestational weeks. Outcome: spontaneous vaginal delivery. Anesthesia: epidural. Postpartum course has been normal. Baby's course has been norma. Baby is feeding by breast. Bleeding thin lochia. Bowel function is normal. Bladder function is normal. Patient is not sexually active. Contraception method is abstinence. Postpartum depression screening: negative.  Tobacco, alcohol and substance abuse history reviewed.  Adult immunizations reviewed including TDAP, rubella and varicella.  The following portions of the patient's history were reviewed and updated as appropriate: allergies, current medications, past family history, past medical history, past social history, past surgical history and problem list.  Review of Systems A comprehensive review of systems was negative.   Objective:   PE:  Deferred   BP 138/77 mmHg  Pulse 96    100% of 10 min visit spent on counseling and coordination of care.  Assessment:    Postpartum, 2 weeks.  Doing well.  Plan:    1. Contraception: IUD 2. Continue PNV's 3. Follow up in: 4 weeks or as needed.   Healthy lifestyle practices reviewed

## 2014-12-29 ENCOUNTER — Telehealth: Payer: Self-pay | Admitting: *Deleted

## 2014-12-29 NOTE — Telephone Encounter (Signed)
Patient is interested in a Mirena IUD for contraception. Per Dr. Clearance CootsHarper schedule patient 4 weeks from her last appointment. Patient has been scheduled for January 20, 2015 @ 10:45. Patient advised not to resume intercourse until after insertion. Patient verbalized understanding.

## 2015-01-20 ENCOUNTER — Ambulatory Visit (INDEPENDENT_AMBULATORY_CARE_PROVIDER_SITE_OTHER): Payer: BLUE CROSS/BLUE SHIELD | Admitting: Obstetrics

## 2015-01-20 ENCOUNTER — Encounter: Payer: Self-pay | Admitting: Obstetrics

## 2015-01-20 DIAGNOSIS — J302 Other seasonal allergic rhinitis: Secondary | ICD-10-CM

## 2015-01-20 DIAGNOSIS — Z30011 Encounter for initial prescription of contraceptive pills: Secondary | ICD-10-CM

## 2015-01-20 MED ORDER — LORATADINE 10 MG PO TABS: 10.0000 mg | ORAL_TABLET | Freq: Every day | ORAL | Status: DC

## 2015-01-20 MED ORDER — NORETHINDRONE 0.35 MG PO TABS: 1.0000 | ORAL_TABLET | Freq: Every day | ORAL | Status: DC

## 2015-01-20 NOTE — Addendum Note (Signed)
Addended by: Coral CeoHARPER, CHARLES A on: 01/20/2015 11:34 AM   Modules accepted: Orders

## 2015-01-20 NOTE — Progress Notes (Addendum)
Subjective:     Mary LevelsJalisa S Mora is a 24 y.o. female who presents for a postpartum visit. She is 6 weeks postpartum following a spontaneous vaginal delivery. I have fully reviewed the prenatal and intrapartum course. The delivery was at 38.3 gestational weeks. Outcome: spontaneous vaginal delivery. Anesthesia: epidural. Postpartum course has been normal. Baby's course has been normal. Baby is feeding by breast. Bleeding no bleeding. Bowel function is normal. Bladder function is normal. Patient is not sexually active. Contraception method is abstinence. Postpartum depression screening: negative.  Tobacco, alcohol and substance abuse history reviewed.  Adult immunizations reviewed including TDAP, rubella and varicella.  The following portions of the patient's history were reviewed and updated as appropriate: allergies, current medications, past family history, past medical history, past social history, past surgical history and problem list.  Review of Systems A comprehensive review of systems was negative.   Objective:    BP 125/84 mmHg  Pulse 76  Wt 175 lb (79.379 kg)  General:  alert and no distress   Breasts:  inspection negative, no nipple discharge or bleeding, no masses or nodularity palpable  Lungs: clear to auscultation bilaterally  Heart:  regular rate and rhythm, S1, S2 normal, no murmur, click, rub or gallop  Abdomen: normal findings: soft, non-tender   Vulva:  normal  Vagina: normal vagina  Cervix:  no cervical motion tenderness  Corpus: normal size, contour, position, consistency, mobility, non-tender  Adnexa:  no mass, fullness, tenderness  Rectal Exam: Not performed.         Assessment:     Normal postpartum exam. Pap smear not done at today's visit.   Contraceptive counseling Plan:    1. Contraception: oral progesterone-only contraceptive 2. Micronor Rx 3. Follow up in: several months or as needed.   Healthy lifestyle practices reviewed

## 2015-04-15 ENCOUNTER — Other Ambulatory Visit: Payer: Self-pay | Admitting: *Deleted

## 2015-04-15 DIAGNOSIS — Z3041 Encounter for surveillance of contraceptive pills: Secondary | ICD-10-CM

## 2015-04-15 MED ORDER — NORETHINDRONE 0.35 MG PO TABS: 1.0000 | ORAL_TABLET | Freq: Every day | ORAL | Status: DC

## 2015-04-22 ENCOUNTER — Ambulatory Visit: Payer: BLUE CROSS/BLUE SHIELD | Admitting: Obstetrics

## 2015-04-29 ENCOUNTER — Ambulatory Visit (INDEPENDENT_AMBULATORY_CARE_PROVIDER_SITE_OTHER): Payer: BLUE CROSS/BLUE SHIELD | Admitting: Obstetrics

## 2015-04-29 ENCOUNTER — Encounter: Payer: Self-pay | Admitting: Obstetrics

## 2015-04-29 VITALS — BP 123/81 | HR 69 | Temp 98.1°F | Wt 192.0 lb

## 2015-04-29 DIAGNOSIS — Z01419 Encounter for gynecological examination (general) (routine) without abnormal findings: Secondary | ICD-10-CM | POA: Diagnosis not present

## 2015-04-29 DIAGNOSIS — Z3041 Encounter for surveillance of contraceptive pills: Secondary | ICD-10-CM

## 2015-04-29 DIAGNOSIS — Z124 Encounter for screening for malignant neoplasm of cervix: Secondary | ICD-10-CM | POA: Diagnosis not present

## 2015-04-29 NOTE — Progress Notes (Signed)
Subjective:        Mary Mora is a 24 y.o. female here for a routine exam.  Current complaints: none.    Personal health questionnaire:  Is patient Mary Mora, have a family history of breast and/or ovarian cancer: no Is there a family history of uterine cancer diagnosed at age < 32, gastrointestinal cancer, urinary tract cancer, family member who is a Personnel officer syndrome-associated carrier: no Is the patient overweight and hypertensive, family history of diabetes, personal history of gestational diabetes, preeclampsia or PCOS: no Is patient over 99, have PCOS,  family history of premature CHD under age 77, diabetes, smoke, have hypertension or peripheral artery disease:  no At any time, has a partner hit, kicked or otherwise hurt or frightened you?: no Over the past 2 weeks, have you felt down, depressed or hopeless?: no Over the past 2 weeks, have you felt little interest or pleasure in doing things?:no   Gynecologic History Patient's last menstrual period was 04/18/2015. Contraception: oral progesterone-only contraceptive Last Pap: 2015. Results were: normal Last mammogram: n/a. Results were: n/a  Obstetric History OB History  Gravida Para Term Preterm AB SAB TAB Ectopic Multiple Living  0 2    # Outcome Date GA Lbr Len/2nd Weight Sex Delivery Anes PTL Lv  3 Term 12/03/14 [redacted]w[redacted]d 08:13 / 00:03 6 lb 6.8 oz (2.915 kg) M Vag-Spont EPI  Y     Comments: WNL  2 TAB 02/22/11 [redacted]w[redacted]d       N     Comments: no complications  1 Term 03/20/10 [redacted]w[redacted]d  7 lb 14 oz (3.572 kg) M Vag-Spont EPI N Y      Past Medical History  Diagnosis Date  . No pertinent past medical history   . Infection   . BV (bacterial vaginosis)   . Heart burn     Past Surgical History  Procedure Laterality Date  . No past surgeries       Current outpatient prescriptions:  .  norethindrone (MICRONOR,CAMILA,ERRIN) 0.35 MG tablet, Take 1 tablet (0.35 mg total) by mouth daily., Disp: 3 Package,  Rfl: 3 .  omeprazole (PRILOSEC) 20 MG capsule, Take 1 capsule (20 mg total) by mouth 2 (two) times daily before a meal., Disp: 60 capsule, Rfl: 5 .  ibuprofen (ADVIL,MOTRIN) 600 MG tablet, Take 1 tablet (600 mg total) by mouth every 6 (six) hours as needed for mild pain. (Patient not taking: Reported on 12/22/2014), Disp: 30 tablet, Rfl: 5 .  Iron-FA-B Cmp-C-Biot-Probiotic (FUSION PLUS) CAPS, Take 1 capsule by mouth daily before breakfast. (Patient not taking: Reported on 01/20/2015), Disp: 30 capsule, Rfl: 5 .  loratadine (CLARITIN) 10 MG tablet, Take 1 tablet (10 mg total) by mouth daily. (Patient not taking: Reported on 04/29/2015), Disp: 30 tablet, Rfl: 11 .  norethindrone (MICRONOR,CAMILA,ERRIN) 0.35 MG tablet, Take 1 tablet (0.35 mg total) by mouth daily. (Patient not taking: Reported on 04/29/2015), Disp: 1 Package, Rfl: 11 .  oxyCODONE-acetaminophen (PERCOCET/ROXICET) 5-325 MG per tablet, Take 1-2 tablets by mouth every 4 (four) hours as needed for moderate pain or severe pain (for pain scale equal to or greater than 7). (Patient not taking: Reported on 01/20/2015), Disp: 40 tablet, Rfl: 0 .  Prenat-FeCbn-FeAspGl-FA-Omega (OB COMPLETE PETITE) 35-5-1-200 MG CAPS, Take 1 capsule by mouth daily. (Patient not taking: Reported on 04/29/2015), Disp: 30 capsule, Rfl: 11 No Known Allergies  History  Substance Use Topics  . Smoking status: Never Smoker   .  Smokeless tobacco: Never Used  . Alcohol Use: No    Family History  Problem Relation Age of Onset  . Hypertension Mother   . Diabetes Maternal Grandmother   . Kidney disease Maternal Grandmother     on dialysis      Review of Systems  Constitutional: negative for fatigue and weight loss Respiratory: negative for cough and wheezing Cardiovascular: negative for chest pain, fatigue and palpitations Gastrointestinal: negative for abdominal pain and change in bowel habits Musculoskeletal:negative for myalgias Neurological: negative for gait  problems and tremors Behavioral/Psych: negative for abusive relationship, depression Endocrine: negative for temperature intolerance   Genitourinary:negative for abnormal menstrual periods, genital lesions, hot flashes, sexual problems and vaginal discharge Integument/breast: negative for breast lump, breast tenderness, nipple discharge and skin lesion(s)    Objective:       BP 123/81 mmHg  Pulse 69  Temp(Src) 98.1 F (36.7 C)  Wt 192 lb (87.091 kg)  LMP 04/18/2015  Breastfeeding? Yes General:   alert  Skin:   no rash or abnormalities  Lungs:   clear to auscultation bilaterally  Heart:   regular rate and rhythm, S1, S2 normal, no murmur, click, rub or gallop  Breasts:   normal without suspicious masses, skin or nipple changes or axillary nodes  Abdomen:  normal findings: no organomegaly, soft, non-tender and no hernia  Pelvis:  External genitalia: normal general appearance Urinary system: urethral meatus normal and bladder without fullness, nontender Vaginal: normal without tenderness, induration or masses Cervix: normal appearance Adnexa: normal bimanual exam Uterus: anteverted and non-tender, normal size   Lab Review Urine pregnancy test Labs reviewed yes Radiologic studies reviewed no    Assessment:    Healthy female exam.    Contraception.  Breast feeding.  Pleased with Micronor.     Plan:   Continue Micronor as long as beast feeding.   Education reviewed: calcium supplements, low fat, low cholesterol diet and weight bearing exercise. Contraception: oral progesterone-only contraceptive. Follow up in: 1 year.   No orders of the defined types were placed in this encounter.   No orders of the defined types were placed in this encounter.

## 2015-04-30 LAB — PAP IG W/ RFLX HPV ASCU

## 2015-11-03 ENCOUNTER — Emergency Department (HOSPITAL_COMMUNITY)
Admission: EM | Admit: 2015-11-03 | Discharge: 2015-11-03 | Disposition: A | Discharge status: Home / Self Care | Payer: BLUE CROSS/BLUE SHIELD | Attending: Emergency Medicine | Admitting: Emergency Medicine

## 2015-11-03 ENCOUNTER — Encounter (HOSPITAL_COMMUNITY): Payer: Self-pay | Admitting: Emergency Medicine

## 2015-11-03 DIAGNOSIS — Z79899 Other long term (current) drug therapy: Secondary | ICD-10-CM | POA: Insufficient documentation

## 2015-11-03 DIAGNOSIS — R0981 Nasal congestion: Secondary | ICD-10-CM

## 2015-11-03 DIAGNOSIS — R1110 Vomiting, unspecified: Secondary | ICD-10-CM

## 2015-11-03 DIAGNOSIS — R6889 Other general symptoms and signs: Secondary | ICD-10-CM

## 2015-11-03 DIAGNOSIS — R05 Cough: Secondary | ICD-10-CM | POA: Diagnosis present

## 2015-11-03 DIAGNOSIS — R059 Cough, unspecified: Secondary | ICD-10-CM

## 2015-11-03 DIAGNOSIS — Z8619 Personal history of other infectious and parasitic diseases: Secondary | ICD-10-CM | POA: Diagnosis not present

## 2015-11-03 DIAGNOSIS — K219 Gastro-esophageal reflux disease without esophagitis: Secondary | ICD-10-CM | POA: Diagnosis not present

## 2015-11-03 DIAGNOSIS — R000 Tachycardia, unspecified: Secondary | ICD-10-CM | POA: Insufficient documentation

## 2015-11-03 DIAGNOSIS — J029 Acute pharyngitis, unspecified: Secondary | ICD-10-CM

## 2015-11-03 DIAGNOSIS — J069 Acute upper respiratory infection, unspecified: Secondary | ICD-10-CM | POA: Insufficient documentation

## 2015-11-03 DIAGNOSIS — Z8742 Personal history of other diseases of the female genital tract: Secondary | ICD-10-CM | POA: Insufficient documentation

## 2015-11-03 LAB — RAPID STREP SCREEN (MED CTR MEBANE ONLY): STREPTOCOCCUS, GROUP A SCREEN (DIRECT): NEGATIVE

## 2015-11-03 NOTE — ED Notes (Signed)
Patient was alert, oriented and stable upon discharge. RN went over AVS and patient had no further questions.  

## 2015-11-03 NOTE — ED Provider Notes (Signed)
CSN: [id number]     Arrival date & time 11/03/15  1339 History   First MD Initiated Contact with Patient 11/03/15 1627     Chief Complaint  Patient presents with  . Generalized Body Aches  . Fever  . Cough     (Consider location/radiation/quality/duration/timing/severity/associated sxs/prior Treatment) HPI Comments: Mary Mora is a 25 y.o. female with a PMHx of GERD, who presents to the ED with complaints of URI complaints x1 week which has been gradually worsening. Patient states that "everyone is sick at work". She describes her symptoms as being sore throat, nasal congestion, generalized body aches, dry cough, fever with Tmax 103, and one episode of posttussive emesis which was nonbloody and nonbilious earlier today. No known aggravating factors, and symptoms have been unrelieved with Sudafed which she last took at 6 AM, and Mucinex. She is a nonsmoker. She denies any chest pain, shortness breath, wheezing, ear pain or drainage, trismus, drooling, rhinorrhea, eye itching or redness, eye drainage, abdominal pain, nausea, ongoing vomiting, diarrhea, constipation, dysuria, hematuria, vaginal bleeding or discharge, numbness, tingling, or weakness.  Patient is a 25 y.o. female presenting with fever, cough, and URI. The history is provided by the patient. No language interpreter was used.  Fever Associated symptoms: congestion, cough, myalgias (generalized body aches), sore throat and vomiting (1x, posttussive only)   Associated symptoms: no chest pain, no confusion, no diarrhea, no dysuria, no ear pain, no nausea and no rhinorrhea   Cough Associated symptoms: fever (Tmax 103), myalgias (generalized body aches) and sore throat   Associated symptoms: no chest pain, no ear pain, no eye discharge, no rhinorrhea, no shortness of breath and no wheezing   URI Presenting symptoms: congestion, cough, fever (Tmax 103) and sore throat   Presenting symptoms: no ear pain and no rhinorrhea   Severity:   Moderate Onset quality:  Gradual Duration:  1 week Timing:  Constant Progression:  Worsening Chronicity:  New Relieved by:  Nothing Worsened by:  Nothing tried Ineffective treatments:  OTC medications Associated symptoms: myalgias (generalized body aches)   Associated symptoms: no arthralgias and no wheezing   Risk factors: no chronic respiratory disease, no diabetes mellitus, no immunosuppression and no recent travel     Past Medical History  Diagnosis Date  . No pertinent past medical history   . Infection   . BV (bacterial vaginosis)   . Heart burn    Past Surgical History  Procedure Laterality Date  . No past surgeries     Family History  Problem Relation Age of Onset  . Hypertension Mother   . Diabetes Maternal Grandmother   . Kidney disease Maternal Grandmother     on dialysis   Social History  Substance Use Topics  . Smoking status: Never Smoker   . Smokeless tobacco: Never Used  . Alcohol Use: No   OB History    Gravida Para Term Preterm AB TAB SAB Ectopic Multiple Living   3 2 2  1 1    0 2     Review of Systems  Constitutional: Positive for fever (Tmax 103).  HENT: Positive for congestion, sinus pressure and sore throat. Negative for drooling, ear discharge, ear pain, rhinorrhea and trouble swallowing.   Eyes: Negative for pain, discharge and itching.  Respiratory: Positive for cough. Negative for shortness of breath and wheezing.   Cardiovascular: Negative for chest pain.  Gastrointestinal: Positive for vomiting (1x, posttussive only). Negative for nausea, abdominal pain, diarrhea and constipation.  Genitourinary: Negative for dysuria,  hematuria, vaginal bleeding and vaginal discharge.  Musculoskeletal: Positive for myalgias (generalized body aches). Negative for arthralgias.  Skin: Negative for color change.  Allergic/Immunologic: Negative for immunocompromised state.  Neurological: Negative for weakness and numbness.  Psychiatric/Behavioral:  Negative for confusion.   10 Systems reviewed and are negative for acute change except as noted in the HPI.    Allergies  Review of patient's allergies indicates no known allergies.  Home Medications   Prior to Admission medications   Medication Sig Start Date End Date Taking? Authorizing Provider  dextromethorphan-guaiFENesin (MUCINEX DM) 30-600 MG 12hr tablet Take 1 tablet by mouth 2 (two) times daily as needed for cough.   Yes Historical Provider, MD  pseudoephedrine (SUDAFED) 30 MG tablet Take 30 mg by mouth every 4 (four) hours as needed for congestion.   Yes Historical Provider, MD  ibuprofen (ADVIL,MOTRIN) 600 MG tablet Take 1 tablet (600 mg total) by mouth every 6 (six) hours as needed for mild pain. Patient not taking: Reported on 12/22/2014 12/05/14   Brock Bad, MD  Iron-FA-B Cmp-C-Biot-Probiotic (FUSION PLUS) CAPS Take 1 capsule by mouth daily before breakfast. Patient not taking: Reported on 01/20/2015 12/05/14   Brock Bad, MD  loratadine (CLARITIN) 10 MG tablet Take 1 tablet (10 mg total) by mouth daily. Patient not taking: Reported on 04/29/2015 01/20/15   Brock Bad, MD  norethindrone (MICRONOR,CAMILA,ERRIN) 0.35 MG tablet Take 1 tablet (0.35 mg total) by mouth daily. Patient not taking: Reported on 04/29/2015 01/20/15   Brock Bad, MD  norethindrone (MICRONOR,CAMILA,ERRIN) 0.35 MG tablet Take 1 tablet (0.35 mg total) by mouth daily. Patient not taking: Reported on 11/03/2015 04/15/15   Brock Bad, MD  omeprazole (PRILOSEC) 20 MG capsule Take 1 capsule (20 mg total) by mouth 2 (two) times daily before a meal. Patient not taking: Reported on 11/03/2015 07/09/14   Brock Bad, MD  oxyCODONE-acetaminophen (PERCOCET/ROXICET) 5-325 MG per tablet Take 1-2 tablets by mouth every 4 (four) hours as needed for moderate pain or severe pain (for pain scale equal to or greater than 7). Patient not taking: Reported on 01/20/2015 12/05/14   Brock Bad, MD   Prenat-FeCbn-FeAspGl-FA-Omega (OB COMPLETE PETITE) 35-5-1-200 MG CAPS Take 1 capsule by mouth daily. Patient not taking: Reported on 04/29/2015 06/19/14   Antionette Char, MD   BP 134/84 mmHg  Pulse 109  Temp(Src) 98.4 F (36.9 C) (Oral)  Resp 20  SpO2 100% Physical Exam  Constitutional: She is oriented to person, place, and time. Vital signs are normal. She appears well-developed and well-nourished.  Non-toxic appearance. No distress.  Afebrile, nontoxic, NAD  HENT:  Head: Normocephalic and atraumatic.  Right Ear: Hearing, tympanic membrane, external ear and ear canal normal.  Left Ear: Hearing, tympanic membrane, external ear and ear canal normal.  Nose: Mucosal edema present.  Mouth/Throat: Uvula is midline, oropharynx is clear and moist and mucous membranes are normal. No trismus in the jaw. No uvula swelling.  Ears are clear bilaterally. Nose with mild mucosal edema. Oropharynx clear and moist, without uvular swelling or deviation, no trismus or drooling, no tonsillar swelling or erythema, no exudates.    Eyes: Conjunctivae and EOM are normal. Right eye exhibits no discharge. Left eye exhibits no discharge.  Neck: Normal range of motion. Neck supple.  Cardiovascular: Regular rhythm, normal heart sounds and intact distal pulses.  Tachycardia present.  Exam reveals no gallop and no friction rub.   No murmur heard. Very mildly tachycardic, which improved during exam  Pulmonary/Chest: Effort normal and breath sounds normal. No respiratory distress. She has no decreased breath sounds. She has no wheezes. She has no rhonchi. She has no rales.  CTAB in all lung fields, no w/r/r, no hypoxia or increased WOB, speaking in full sentences, SpO2 100% on RA   Abdominal: Soft. Normal appearance and bowel sounds are normal. She exhibits no distension. There is no tenderness. There is no rigidity, no rebound, no guarding and no CVA tenderness.  Musculoskeletal: Normal range of motion.  MAE  x4 Strength and sensation grossly intact Distal pulses intact Gait steady  Lymphadenopathy:    She has cervical adenopathy.  Shotty cervical LAD which is nonTTP  Neurological: She is alert and oriented to person, place, and time. She has normal strength. No sensory deficit.  Skin: Skin is warm, dry and intact. No rash noted.  Psychiatric: She has a normal mood and affect.  Nursing note and vitals reviewed.   ED Course  Procedures (including critical care time) Labs Review Labs Reviewed  RAPID STRE[facility]CREEN (NOT [facility])  CULTURE, GROUP A STREP [facility])    Imaging Review No results found. I have personally reviewed and evaluated these images and lab results as part of my medical decision-making.   EKG Interpretation None      MDM   Final diagnoses:  URI (upper respiratory infection)  Flu-like symptoms  Cough  Nasal congestion  Post-tussive emesis  Sore throat    25 y.o. female here with likely viral URI. Pt is afebrile with a clear lung exam. Mild sinus congestion. Throat clear, RST obtained in triage was negative and I doubt strep as a cause. Doubt need for CXR given clear lung exam. Likely viral URI. Pt is agreeable to symptomatic treatment with close follow up with PCP as needed but spoke at length about emergent changing or worsening of symptoms that should prompt return to ER. Pt voices understanding and is agreeable to plan. Stable at time of discharge.   BP 136/73 mmHg  Pulse 99  Temp(Src) 99.6 F (37.6 C) (Oral)  Resp 17  SpO2 100%    Mary Prowell Camprubi-Soms, PA-C 11/03/15 1703  Linwood DibblesJon Knapp, MD 11/03/15 1708

## 2015-11-03 NOTE — ED Notes (Addendum)
Coughing, generalized body aches, fever up to 103 w/i last 48 hours, sore throat, nasal congestion, post tussive vomiting x 1 week. No relief with OTC medication, took cold medication at 0600 this a.m.  Placed in mask at this time.

## 2015-11-03 NOTE — Discharge Instructions (Signed)
Continue to stay well-hydrated. Gargle warm salt water and spit it out. Continue to alternate between Tylenol and Ibuprofen for pain or fever. Use Mucinex for cough suppression/expectoration of mucus. Use netipot and flonase to help with nasal congestion. May consider over-the-counter Benadryl or other antihistamine to decrease secretions and for watery itchy eyes. Followup with your primary care doctor in 5-7 days for recheck of ongoing symptoms. Return to emergency department for emergent changing or worsening of symptoms.   Viral Infections A virus is a type of germ. Viruses can cause:  Minor sore throats.  Aches and pains.  Headaches.  Runny nose.  Rashes.  Watery eyes.  Tiredness.  Coughs.  Loss of appetite.  Feeling sick to your stomach (nausea).  Throwing up (vomiting).  Watery poop (diarrhea). HOME CARE   Only take medicines as told by your doctor.  Drink enough water and fluids to keep your pee (urine) clear or pale yellow. Sports drinks are a good choice.  Get plenty of rest and eat healthy. Soups and broths with crackers or rice are fine. GET HELP RIGHT AWAY IF:   You have a very bad headache.  You have shortness of breath.  You have chest pain or neck pain.  You have an unusual rash.  You cannot stop throwing up.  You have watery poop that does not stop.  You cannot keep fluids down.  You or your child has a temperature by mouth above 102 F (38.9 C), not controlled by medicine.  Your baby is older than 3 months with a rectal temperature of 102 F (38.9 C) or higher.  Your baby is 95 months old or younger with a rectal temperature of 100.4 F (38 C) or higher. MAKE SURE YOU:   Understand these instructions.  Will watch this condition.  Will get help right away if you are not doing well or get worse.   This information is not intended to replace advice given to you by your health care provider. Make sure you discuss any questions you have  with your health care provider.   Document Released: 09/22/2008 Document Revised: 01/02/2012 Document Reviewed: 03/18/2015 Elsevier Interactive Patient Education 2016 Else[employer] Inc.  Sore Throat A sore throat is a painful, burning, sore, or scratchy feeling of the throat. There may be pain or tenderness when swallowing or talking. You may have other symptoms with a sore throat. These include coughing, sneezing, fever, or a swollen neck. A sore throat is often the first sign of another sickness. These sicknesses may include a cold, flu, strep throat, or an infection called mono. Most sore throats go away without medical treatment.  HOME CARE   Only take medicine as told by your doctor.  Drink enough fluids to keep your pee (urine) clear or pale yellow.  Rest as needed.  Try using throat sprays, lozenges, or suck on hard candy (if older than 4 years or as told).  Sip warm liquids, such as broth, herbal tea, or warm water with honey. Try sucking on frozen ice pops or drinking cold liquids.  Rinse the mouth (gargle) with salt water. Mix 1 teaspoon salt with 8 ounces of water.  Do not smoke. Avoid being around others when they are smoking.  Put a humidifier in your bedroom at night to moisten the air. You can also turn on a hot shower and sit in the bathroom for 5-10 minutes. Be sure the bathroom door is closed. GET HELP RIGHT AWAY IF:   You have trouble  breathing.  You cannot swallow fluids, soft foods, or your spit (saliva).  You have more puffiness (swelling) in the throat.  Your sore throat does not get better in 7 days.  You feel sick to your stomach (nauseous) and throw up (vomit).  You have a fever or lasting symptoms for more than 2-3 days.  You have a fever and your symptoms suddenly get worse. MAKE SURE YOU:   Understand these instructions.  Will watch your condition.  Will get help right away if you are not doing well or get worse.   This information is not  intended to replace advice given to you by your health care provider. Make sure you discuss any questions you have with your health care provider.   Document Released: 07/19/2008 Document Revised: 07/04/2012 Document Reviewed: 06/17/2012 Elsevier Interactive Patient Education 2016 Elsevier Inc.  Cough, Adult A cough helps to clear your throat and lungs. A cough may last only 2-3 weeks (acute), or it may last longer than 8 weeks (chronic). Many different things can cause a cough. A cough may be a sign of an illness or another medical condition. HOME CARE  Pay attention to any changes in your cough.  Take medicines only as told by your doctor.  If you were prescribed an antibiotic medicine, take it as told by your doctor. Do not stop taking it even if you start to feel better.  Talk with your doctor before you try using a cough medicine.  Drink enough fluid to keep your pee (urine) clear or pale yellow.  If the air is dry, use a cold steam vaporizer or humidifier in your home.  Stay away from things that make you cough at work or at home.  If your cough is worse at night, try using extra pillows to raise your head up higher while you sleep.  Do not smoke, and try not to be around smoke. If you need help quitting, ask your doctor.  Do not have caffeine.  Do not drink alcohol.  Rest as needed. GET HELP IF:  You have new problems (symptoms).  You cough up yellow fluid (pus).  Your cough does not get better after 2-3 weeks, or your cough gets worse.  Medicine does not help your cough and you are not sleeping well.  You have pain that gets worse or pain that is not helped with medicine.  You have a fever.  You are losing weight and you do not know why.  You have night sweats. GET HELP RIGHT AWAY IF:  You cough up blood.  You have trouble breathing.  Your heartbeat is very fast.   This information is not intended to replace advice given to you by your health care  provider. Make sure you discuss any questions you have with your health care provider.   Document Released: 06/23/2011 Document Revised: 07/01/2015 Document Reviewed: 12/17/2014 Elsevier Interactive Patient Education [employer]

## 2015-11-06 LAB — CULTURE, GROUP A STREP (THRC)

## 2016-02-02 DIAGNOSIS — Z029 Encounter for administrative examinations, unspecified: Secondary | ICD-10-CM

## 2016-05-02 ENCOUNTER — Ambulatory Visit: Payer: BLUE CROSS/BLUE SHIELD | Admitting: Obstetrics

## 2016-12-05 ENCOUNTER — Ambulatory Visit (INDEPENDENT_AMBULATORY_CARE_PROVIDER_SITE_OTHER): Payer: BLUE CROSS/BLUE SHIELD | Admitting: Obstetrics

## 2016-12-05 ENCOUNTER — Encounter: Payer: Self-pay | Admitting: Obstetrics

## 2016-12-05 VITALS — BP 131/86 | HR 99 | Wt 197.2 lb

## 2016-12-05 DIAGNOSIS — E559 Vitamin D deficiency, unspecified: Secondary | ICD-10-CM

## 2016-12-05 DIAGNOSIS — Z0000 Encounter for general adult medical examination without abnormal findings: Secondary | ICD-10-CM

## 2016-12-05 DIAGNOSIS — Z3009 Encounter for other general counseling and advice on contraception: Secondary | ICD-10-CM

## 2016-12-05 DIAGNOSIS — Z01419 Encounter for gynecological examination (general) (routine) without abnormal findings: Secondary | ICD-10-CM | POA: Diagnosis not present

## 2016-12-05 DIAGNOSIS — Z113 Encounter for screening for infections with a predominantly sexual mode of transmission: Secondary | ICD-10-CM

## 2016-12-05 DIAGNOSIS — E6609 Other obesity due to excess calories: Secondary | ICD-10-CM

## 2016-12-05 MED ORDER — PHENTERMINE HCL 37.5 MG PO CAPS: 37.5000 mg | ORAL_CAPSULE | ORAL | 2 refills | Status: DC

## 2016-12-05 MED ORDER — LO LOESTRIN FE 1 MG-10 MCG / 10 MCG PO TABS: 1.0000 | ORAL_TABLET | Freq: Every day | ORAL | 4 refills | Status: DC

## 2016-12-05 NOTE — Progress Notes (Signed)
Patient states she gets yeast infection after each cycle- past 2 cycles. Patient gets itching with discharge that resolves without treatment. Symptoms normally last 1-2 days.

## 2016-12-05 NOTE — Progress Notes (Signed)
Subjective:        Mary Mora is a 26 y.o. female here for a routine exam.  Current complaints: Undesired weight gain..    Personal health questionnaire:  Is patient [name], have a family history of breast and/or ovarian cancer: no Is there a family history of uterine cancer diagnosed at age < 79, gastrointestinal cancer, urinary tract cancer, family member who is a Personnel officer syndrome-associated carrier: no Is the patient overweight and hypertensive, family history of diabetes, personal history of gestational diabetes, preeclampsia or PCOS: no Is patient over 19, have PCOS,  family history of premature CHD under age 73, diabetes, smoke, have hypertension or peripheral artery disease:  no At any time, has a partner hit, kicked or otherwise hurt or frightened you?: no Over the past 2 weeks, have you felt down, depressed or hopeless?: no Over the past 2 weeks, have you felt little interest or pleasure in doing things?:no   Gynecologic History Patient's last menstrual period was 11/17/2016 (exact date). Contraception: condoms Last Pap: 2016. Results were: normal Last mammogram: n/a. Results were: n/a  Obstetric History OB History  Gravida Para Term Preterm AB Living  3 2 2   1 2   SAB TAB Ectopic Multiple Live Births    1   0 2    # Outcome Date GA Lbr Len/2nd Weight Sex Delivery Anes PTL Lv  3 Term 12/03/14 [redacted]w[redacted]d 08:13 / 00:03 6 lb 6.8 oz (2.915 kg) M Vag-Spont EPI  LIV     Birth Comments: WNL  2 TAB 02/22/11 [redacted]w[redacted]d       DEC     Birth Comments: no complications  1 Term 03/20/10 [redacted]w[redacted]d  7 lb 14 oz (3.572 kg) M Vag-Spont EPI N LIV      Past Medical History:  Diagnosis Date  . BV (bacterial vaginosis)   . Heart burn   . Infection   . No pertinent past medical history     Past Surgical History:  Procedure Laterality Date  . NO PAST SURGERIES       Current Outpatient Prescriptions:  .  dextromethorphan-guaiFENesin (MUCINEX DM) 30-600 MG 12hr tablet, Take 1  tablet by mouth 2 (two) times daily as needed for cough., Disp: , Rfl:  .  ibuprofen (ADVIL,MOTRIN) 600 MG tablet, Take 1 tablet (600 mg total) by mouth every 6 (six) hours as needed for mild pain. (Patient not taking: Reported on 12/22/2014), Disp: 30 tablet, Rfl: 5 .  Iron-FA-B Cmp-C-Biot-Probiotic (FUSION PLUS) CAPS, Take 1 capsule by mouth daily before breakfast. (Patient not taking: Reported on 01/20/2015), Disp: 30 capsule, Rfl: 5 .  LO LOESTRIN FE 1 MG-10 MCG / 10 MCG tablet, Take 1 tablet by mouth daily., Disp: 3 Package, Rfl: 4 .  loratadine (CLARITIN) 10 MG tablet, Take 1 tablet (10 mg total) by mouth daily. (Patient not taking: Reported on 04/29/2015), Disp: 30 tablet, Rfl: 11 .  norethindrone (MICRONOR,CAMILA,ERRIN) 0.35 MG tablet, Take 1 tablet (0.35 mg total) by mouth daily. (Patient not taking: Reported on 04/29/2015), Disp: 1 Package, Rfl: 11 .  norethindrone (MICRONOR,CAMILA,ERRIN) 0.35 MG tablet, Take 1 tablet (0.35 mg total) by mouth daily. (Patient not taking: Reported on 11/03/2015), Disp: 3 Package, Rfl: 3 .  omeprazole (PRILOSEC) 20 MG capsule, Take 1 capsule (20 mg total) by mouth 2 (two) times daily before a meal. (Patient not taking: Reported on 11/03/2015), Disp: 60 capsule, Rfl: 5 .  oxyCODONE-acetaminophen (PERCOCET/ROXICET) 5-325 MG per tablet, Take 1-2 tablets by mouth every  4 (four) hours as needed for moderate pain or severe pain (for pain scale equal to or greater than 7). (Patient not taking: Reported on 01/20/2015), Disp: 40 tablet, Rfl: 0 .  phentermine 37.5 MG capsule, Take 1 capsule (37.5 mg total) by mouth every morning., Disp: 30 capsule, Rfl: 2 .  Prenat-FeCbn-FeAspGl-FA-Omega (OB COMPLETE PETITE) 35-5-1-200 MG CAPS, Take 1 capsule by mouth daily. (Patient not taking: Reported on 04/29/2015), Disp: 30 capsule, Rfl: 11 .  pseudoephedrine (SUDAFED) 30 MG tablet, Take 30 mg by mouth every 4 (four) hours as needed for congestion., Disp: , Rfl:  No Known Allergies  Social  History  Substance Use Topics  . Smoking status: Never Smoker  . Smokeless tobacco: Never Used  . Alcohol use No    Family History  Problem Relation Age of Onset  . Hypertension Mother   . Diabetes Maternal Grandmother   . Kidney disease Maternal Grandmother     on dialysis      Review of Systems  Constitutional: negative for fatigue and weight loss Respiratory: negative for cough and wheezing Cardiovascular: negative for chest pain, fatigue and palpitations Gastrointestinal: negative for abdominal pain and change in bowel habits Musculoskeletal:negative for myalgias Neurological: negative for gait problems and tremors Behavioral/Psych: negative for abusive relationship, depression Endocrine: negative for temperature intolerance    Genitourinary:negative for abnormal menstrual periods, genital lesions, hot flashes, sexual problems and vaginal discharge Integument/breast: negative for breast lump, breast tenderness, nipple discharge and skin lesion(s)    Objective:       BP 131/86   Pulse 99   Wt 197 lb 3.2 oz (89.4 kg)   LMP 11/17/2016 (Exact Date)   BMI 37.26 kg/m  General:   alert  Skin:   no rash or abnormalities  Lungs:   clear to auscultation bilaterally  Heart:   regular rate and rhythm, S1, S2 normal, no murmur, click, rub or gallop  Breasts:   normal without suspicious masses, skin or nipple changes or axillary nodes  Abdomen:  normal findings: no organomegaly, soft, non-tender and no hernia  Pelvis:  External genitalia: normal general appearance Urinary system: urethral meatus normal and bladder without fullness, nontender Vaginal: normal without tenderness, induration or masses Cervix: normal appearance Adnexa: normal bimanual exam Uterus: anteverted and non-tender, normal size   Lab Review Urine pregnancy test Labs reviewed yes Radiologic studies reviewed no  50% of 20 min visit spent on counseling and coordination of care.    Assessment:     Healthy female exam.    Contraceptive Counseling and Advice  Obesity ( BMI = 37 )   Plan:    Lo Loestrin Fe Rx  Phentermine Rx for weight management along with a recommendation for dietary changes and exercise  Education reviewed: calcium supplements, low fat, low cholesterol diet, safe sex/STD prevention, self breast exams and weight bearing exercise. Contraception: OCP (estrogen/progesterone). Follow up in: 1 year.   Meds ordered this encounter  Medications  . LO LOESTRIN FE 1 MG-10 MCG / 10 MCG tablet    Sig: Take 1 tablet by mouth daily.    Dispense:  3 Package    Refill:  4    Submit other coverage code 3  BIN:  [id number]  PCN:  CN   GRP:  [id number]   :  [id number]:  [id number]  . phentermine 37.5 MG capsule    Sig: Take 1 capsule (37.5 mg total) by mouth every morning.    Dispense:  30 capsule    Refill:  2  No orders of the defined types were placed in this encounter.    Patient ID: Mary Mora, female   DOB: 28-Dec-1990, 26 y.o.   MRN: [id number]

## 2016-12-06 LAB — CERVICOVAGINAL ANCILLARY ONLY
BACTERIAL VAGINITIS: POSITIVE — AB
CANDIDA VAGINITIS: NEGATIVE
Chlamydia: NEGATIVE
NEISSERIA GONORRHEA: NEGATIVE
Trichomonas: NEGATIVE

## 2016-12-07 LAB — CYTOLOGY - PAP: Diagnosis: NEGATIVE

## 2016-12-08 ENCOUNTER — Other Ambulatory Visit: Payer: Self-pay | Admitting: Obstetrics

## 2016-12-08 DIAGNOSIS — B9689 Other specified bacterial agents as the cause of diseases classified elsewhere: Secondary | ICD-10-CM

## 2016-12-08 DIAGNOSIS — N760 Acute vaginitis: Principal | ICD-10-CM

## 2016-12-08 MED ORDER — TINIDAZOLE 500 MG PO TABS: 1000.0000 mg | ORAL_TABLET | Freq: Every day | ORAL | 2 refills | Status: DC

## 2017-05-16 ENCOUNTER — Encounter: Payer: Self-pay | Admitting: Internal Medicine

## 2017-05-20 ENCOUNTER — Emergency Department (HOSPITAL_COMMUNITY)
Admission: EM | Admit: 2017-05-20 | Discharge: 2017-05-20 | Disposition: A | Discharge status: Home / Self Care | Payer: BLUE CROSS/BLUE SHIELD | Attending: Emergency Medicine | Admitting: Emergency Medicine

## 2017-05-20 ENCOUNTER — Emergency Department (HOSPITAL_COMMUNITY): Payer: BLUE CROSS/BLUE SHIELD

## 2017-05-20 ENCOUNTER — Encounter (HOSPITAL_COMMUNITY): Payer: Self-pay | Admitting: Emergency Medicine

## 2017-05-20 DIAGNOSIS — R079 Chest pain, unspecified: Secondary | ICD-10-CM | POA: Diagnosis not present

## 2017-05-20 DIAGNOSIS — R05 Cough: Secondary | ICD-10-CM | POA: Diagnosis not present

## 2017-05-20 DIAGNOSIS — R0602 Shortness of breath: Secondary | ICD-10-CM | POA: Diagnosis not present

## 2017-05-20 DIAGNOSIS — K219 Gastro-esophageal reflux disease without esophagitis: Secondary | ICD-10-CM

## 2017-05-20 DIAGNOSIS — Z79899 Other long term (current) drug therapy: Secondary | ICD-10-CM | POA: Diagnosis not present

## 2017-05-20 DIAGNOSIS — R059 Cough, unspecified: Secondary | ICD-10-CM

## 2017-05-20 LAB — RAPID STREP SCREEN (MED CTR MEBANE ONLY): Streptococcus, Group A Screen (Direct): NEGATIVE

## 2017-05-20 LAB — POC URINE PREG, ED: Preg Test, Ur: NEGATIVE

## 2017-05-20 MED ORDER — FLUTICASONE PROPIONATE 50 MCG/ACT NA SUSP: 1.0000 | Freq: Every day | NASAL | 2 refills | Status: DC

## 2017-05-20 MED ORDER — ESOMEPRAZOLE MAGNESIUM 40 MG PO CPDR: 40.0000 mg | DELAYED_RELEASE_CAPSULE | Freq: Every day | ORAL | 0 refills | Status: DC

## 2017-05-20 MED ORDER — BENZONATATE 100 MG PO CAPS: 100.0000 mg | ORAL_CAPSULE | Freq: Three times a day (TID) | ORAL | 0 refills | Status: DC

## 2017-05-20 NOTE — ED Triage Notes (Signed)
Patient here from home with complaints of cough for 1 week and sore throat. Thinks the sore throat may be related to acid reflux.

## 2017-05-20 NOTE — Discharge Instructions (Signed)
Take Nexium daily. Use Flonase at night. You may use Tessalon Perles as needed for cough. Try to eat small meals, avoiding spicy, fatty, or acidic foods. Follow-up with the gastroenterologist for further evaluation of your symptoms. Return to the emergency department if you develop fever, chills, chest pain, or any new or worsening symptoms.

## 2017-05-20 NOTE — ED Provider Notes (Signed)
[facility]-EMERGENCY DEPT Provider Note   CSN: [id number] Arrival date & time: 05/20/17  1505   By signing my name below, I, Soijett Blue, attest that this documentation has been prepared under the direction and in the presence of Alveria ApleySophia Steve Youngberg, PA-C Electronically Signed: Soijett Blue, ED Scribe. 05/20/17. 9:44 PM.  History   Chief Complaint Chief Complaint  Patient presents with  . Cough  . Sore Throat    HPI [name] is a 26 y.o. female who presents to the Emergency Department complaining of cough onset 1 week ago. Pt reports occasional associated post-tussive emesis and SOB while she's coughing. She has had frequent vomiting x1 month, and reports association with vomiting after eating. Pt has tried delsym for her cough with no relief of her symptoms. She notes that her cough is worsened at night and often wakes her up. She has an appointment with a GI specialist in September. She notes that she used to take omeprazole but reports that she hasn't had any in "awhile," but feels her sxs are related to possible reflux. She denies ear pain, eye pain, HA, neck pain, fever, chills, diarrhea, abdominal pain, nausea, dysuria, hematuria, frequency, sick contacts, seasonal allergies, and any other symptoms. Denies hx of asthma, smoking cigarettes, or illicit drug use.   The history is provided by the patient. No language interpreter was used.    Past Medical History:  Diagnosis Date  . BV (bacterial vaginosis)   . Heart burn   . Infection   . No pertinent past medical history     Patient Active Problem List   Diagnosis Date Noted  . Indication for care in labor or delivery 12/03/2014  . NSVD (normal spontaneous vaginal delivery) 12/03/2014  . Iron deficiency anemia 06/15/2014  . Supervision of other normal pregnancy 06/11/2014    Past Surgical History:  Procedure Laterality Date  . NO PAST SURGERIES      OB History    Gravida Para Term Preterm AB Living   3 2 2   1 2    SAB TAB Ectopic Multiple Live Births     1   0 2       Home Medications    Prior to Admission medications   Medication Sig Start Date End Date Taking? Authorizing Provider  LO LOESTRIN FE 1 MG-10 MCG / 10 MCG tablet Take 1 tablet by mouth daily. 12/05/16  Yes Brock BadHarper, Charles A, MD  phentermine 37.5 MG capsule Take 1 capsule (37.5 mg total) by mouth every morning. 12/05/16  Yes Brock BadHarper, Charles A, MD  benzonatate (TESSALON) 100 MG capsule Take 1 capsule (100 mg total) by mouth every 8 (eight) hours. 05/20/17   Niyati Heinke, PA-C  esomeprazole (NEXIUM) 40 MG capsule Take 1 capsule (40 mg total) by mouth daily. 05/20/17   Niko Penson, PA-C  fluticasone (FLONASE) 50 MCG/ACT nasal spray Place 1 spray into both nostrils daily. 05/20/17   Yeimi Debnam, PA-C    Family History Family History  Problem Relation Age of Onset  . Hypertension Mother   . Diabetes Maternal Grandmother   . Kidney disease Maternal Grandmother        on dialysis    Social History Social History  Substance Use Topics  . Smoking status: Never Smoker  . Smokeless tobacco: Never Used  . Alcohol use No     Allergies   Patient has no known allergies.   Review of Systems Review of Systems  Constitutional: Negative for chills and fever.  HENT:  Positive for sore throat. Negative for ear pain.   Eyes: Negative for pain.  Respiratory: Positive for cough and shortness of breath.   Cardiovascular: Negative for chest pain and palpitations.  Gastrointestinal: Positive for vomiting (post-tussive). Negative for abdominal pain, diarrhea and nausea.  Genitourinary: Negative for dysuria, frequency and hematuria.  Musculoskeletal: Negative for neck pain.  Skin: Negative for rash and wound.  Neurological: Negative for headaches.     Physical Exam Updated Vital Signs BP 132/79   Pulse 81   Temp 98.1 F (36.7 C)   Resp 17   SpO2 100%   Physical Exam  Constitutional: She is oriented to person, place,  and time. She appears well-developed and well-nourished. No distress.  HENT:  Head: Normocephalic and atraumatic.  Right Ear: Tympanic membrane, external ear and ear canal normal.  Left Ear: Tympanic membrane, external ear and ear canal normal.  Nose: Mucosal edema present. Right sinus exhibits no maxillary sinus tenderness and no frontal sinus tenderness. Left sinus exhibits no maxillary sinus tenderness and no frontal sinus tenderness.  Mouth/Throat: Uvula is midline, oropharynx is clear and moist and mucous membranes are normal. No oropharyngeal exudate, posterior oropharyngeal edema or posterior oropharyngeal erythema.  Eyes: EOM are normal.  Neck: Normal range of motion. Neck supple.  Cardiovascular: Normal rate, regular rhythm, normal heart sounds and intact distal pulses.  Exam reveals no gallop and no friction rub.   No murmur heard. Pulmonary/Chest: Effort normal and breath sounds normal. No respiratory distress. She has no wheezes. She has no rales.  Abdominal: Soft. She exhibits no distension. There is no tenderness.  Musculoskeletal: Normal range of motion.  Lymphadenopathy:    She has no cervical adenopathy.  Neurological: She is alert and oriented to person, place, and time.  Skin: Skin is warm and dry.  Psychiatric: She has a normal mood and affect. Her behavior is normal.  Nursing note and vitals reviewed.    ED Treatments / Results  DIAGNOSTIC STUDIES: Oxygen Saturation is 100% on RA, nl by my interpretation.    COORDINATION OF CARE: 3:33 PM Discussed treatment plan with pt at bedside and pt agreed to plan.   Labs (all labs ordered are listed, but only abnormal results are displayed) Labs Reviewed  RAPID STREP SCREEN (NOT AT [facility])  CULTURE, GROUP A STREP ([facility])  POC URINE PREG, ED    EKG  EKG Interpretation None       Radiology Dg Chest 2 View  Result Date: 05/20/2017 CLINICAL DATA:  Productive cough, shortness of breath, chest pain EXAM: CHEST  2  VIEW COMPARISON:  None. FINDINGS: Heart and mediastinal contours are within normal limits. No focal opacities or effusions. No acute bony abnormality. IMPRESSION: No active cardiopulmonary disease. Electronically Signed   By: Charlett Nose M.D.   On: 05/20/2017 16:07    Procedures Procedures (including critical care time)  Medications Ordered in ED Medications - No data to display   Initial Impression / Assessment and Plan / ED Course  I have reviewed the triage vital signs and the nursing notes.  Pertinent labs & imaging results that were available during my care of the patient were reviewed by me and considered in my medical decision making (see chart for details).     Pt with 1 mo h/o frequent emesis and 1 wk h/o cough. Cough worse at night. No other infectious sxs reported.  Pt with negative strep. No abx indicated at this time. Discussed that results of strep culture are pending and  patient will be informed if positive result and abx will be called in at that time. No evidence of dehydration. Pt is tolerating secretions well. Presentation not concerning for peritonsillar abscess or spread of infection to deep spaces of the throat; patent airway. CXR negative for acute infiltrate or obvious signs of tracheal changes. Pt likely with GERD and irritation of the LES. Pt will be discharged home with flonase for nasal mucosal edema and nexium prescriptions. Tessalon perles for cough. Return precautions discussed. Pt to keep GI follow up. Pt appears safe for discharge. Pt states she understands and agrees to plan.   Final Clinical Impressions(s) / ED Diagnoses   Final diagnoses:  Cough  Gastroesophageal reflux disease, esophagitis presence not specified    New Prescriptions Discharge Medication List as of 05/20/2017  4:59 PM    START taking these medications   Details  benzonatate (TESSALON) 100 MG capsule Take 1 capsule (100 mg total) by mouth every 8 (eight) hours., Starting Sat  05/20/2017, Print    esomeprazole (NEXIUM) 40 MG capsule Take 1 capsule (40 mg total) by mouth daily., Starting Sat 05/20/2017, Print    fluticasone (FLONASE) 50 MCG/ACT nasal spray Place 1 spray into both nostrils daily., Starting Sat 05/20/2017, Print       I personally performed the services described in this documentation, which was scribed in my presence. The recorded information has been reviewed and is accurate.    Alveria ApleyCaccavale, Milaya Hora, PA-C 05/20/17 2145    Alveria ApleyCaccavale, Elisabella Hacker, PA-C 05/20/17 2151    Mancel BaleWentz, Elliott, MD 05/22/17 1335

## 2017-05-23 LAB — CULTURE, GROUP A STREP (THRC)

## 2017-06-06 DIAGNOSIS — D649 Anemia, unspecified: Secondary | ICD-10-CM | POA: Diagnosis not present

## 2017-06-06 DIAGNOSIS — R1314 Dysphagia, pharyngoesophageal phase: Secondary | ICD-10-CM | POA: Diagnosis not present

## 2017-06-06 DIAGNOSIS — R05 Cough: Secondary | ICD-10-CM | POA: Diagnosis not present

## 2017-06-16 DIAGNOSIS — K229 Disease of esophagus, unspecified: Secondary | ICD-10-CM | POA: Diagnosis not present

## 2017-06-16 DIAGNOSIS — R1310 Dysphagia, unspecified: Secondary | ICD-10-CM | POA: Diagnosis not present

## 2017-06-16 DIAGNOSIS — K219 Gastro-esophageal reflux disease without esophagitis: Secondary | ICD-10-CM | POA: Diagnosis not present

## 2017-06-16 DIAGNOSIS — T18128A Food in esophagus causing other injury, initial encounter: Secondary | ICD-10-CM | POA: Diagnosis not present

## 2017-06-20 ENCOUNTER — Other Ambulatory Visit: Payer: Self-pay | Admitting: Gastroenterology

## 2017-06-20 DIAGNOSIS — K219 Gastro-esophageal reflux disease without esophagitis: Secondary | ICD-10-CM | POA: Diagnosis not present

## 2017-06-28 ENCOUNTER — Encounter (HOSPITAL_COMMUNITY): Payer: Self-pay

## 2017-06-28 ENCOUNTER — Ambulatory Visit (HOSPITAL_COMMUNITY)
Admission: RE | Admit: 2017-06-28 | Discharge: 2017-06-28 | Disposition: A | Discharge status: Home / Self Care | Payer: BLUE CROSS/BLUE SHIELD | Source: Ambulatory Visit | Attending: Gastroenterology | Admitting: Gastroenterology

## 2017-06-28 ENCOUNTER — Encounter (HOSPITAL_COMMUNITY)
Admission: RE | Disposition: A | Discharge status: Home / Self Care | Payer: Self-pay | Source: Ambulatory Visit | Attending: Gastroenterology

## 2017-06-28 DIAGNOSIS — K220 Achalasia of cardia: Secondary | ICD-10-CM | POA: Insufficient documentation

## 2017-06-28 DIAGNOSIS — R1310 Dysphagia, unspecified: Secondary | ICD-10-CM | POA: Diagnosis not present

## 2017-06-28 HISTORY — PX: ESOPHAGEAL MANOMETRY: SHX5429

## 2017-06-28 SURGERY — MANOMETRY, ESOPHAGUS

## 2017-06-28 MED ORDER — LIDOCAINE VISCOUS 2 % MT SOLN
OROMUCOSAL | Status: AC
  Filled 2017-06-28: qty 15

## 2017-06-28 SURGICAL SUPPLY — 2 items
FACESHIELD LNG OPTICON STERILE (SAFETY) IMPLANT
GLOVE BIO SURGEON STRL SZ8 (GLOVE) ×4 IMPLANT

## 2017-06-28 NOTE — Progress Notes (Signed)
Esophageal Manometry done per protocol.  Pt. Tolerated well, w/o complications.  Dr Bosie ClosSchooler to be notified today.    Omelia BlackwaterShelby Jawanna Dykman, RN

## 2017-06-29 ENCOUNTER — Encounter (HOSPITAL_COMMUNITY): Payer: Self-pay | Admitting: Gastroenterology

## 2017-07-06 ENCOUNTER — Ambulatory Visit: Payer: BLUE CROSS/BLUE SHIELD | Admitting: Internal Medicine

## 2017-07-06 DIAGNOSIS — D649 Anemia, unspecified: Secondary | ICD-10-CM | POA: Diagnosis not present

## 2017-07-06 DIAGNOSIS — K220 Achalasia of cardia: Secondary | ICD-10-CM | POA: Diagnosis not present

## 2017-07-06 DIAGNOSIS — R1310 Dysphagia, unspecified: Secondary | ICD-10-CM | POA: Diagnosis not present

## 2017-07-27 DIAGNOSIS — K220 Achalasia of cardia: Secondary | ICD-10-CM | POA: Diagnosis not present

## 2017-07-27 DIAGNOSIS — R1310 Dysphagia, unspecified: Secondary | ICD-10-CM | POA: Diagnosis not present

## 2017-09-01 DIAGNOSIS — K220 Achalasia of cardia: Secondary | ICD-10-CM | POA: Insufficient documentation

## 2017-09-01 DIAGNOSIS — R1310 Dysphagia, unspecified: Secondary | ICD-10-CM | POA: Diagnosis not present

## 2017-09-01 DIAGNOSIS — K228 Other specified diseases of esophagus: Secondary | ICD-10-CM | POA: Diagnosis not present

## 2017-09-01 DIAGNOSIS — D509 Iron deficiency anemia, unspecified: Secondary | ICD-10-CM | POA: Diagnosis not present

## 2017-09-02 DIAGNOSIS — R1310 Dysphagia, unspecified: Secondary | ICD-10-CM | POA: Diagnosis not present

## 2017-09-02 DIAGNOSIS — D509 Iron deficiency anemia, unspecified: Secondary | ICD-10-CM | POA: Diagnosis not present

## 2017-09-02 DIAGNOSIS — K220 Achalasia of cardia: Secondary | ICD-10-CM | POA: Diagnosis not present

## 2017-09-08 DIAGNOSIS — R079 Chest pain, unspecified: Secondary | ICD-10-CM | POA: Diagnosis not present

## 2017-09-08 DIAGNOSIS — D508 Other iron deficiency anemias: Secondary | ICD-10-CM | POA: Diagnosis not present

## 2017-09-08 DIAGNOSIS — Z6828 Body mass index (BMI) 28.0-28.9, adult: Secondary | ICD-10-CM | POA: Diagnosis not present

## 2017-09-08 DIAGNOSIS — K220 Achalasia of cardia: Secondary | ICD-10-CM | POA: Diagnosis not present

## 2017-12-14 DIAGNOSIS — K220 Achalasia of cardia: Secondary | ICD-10-CM | POA: Diagnosis not present

## 2018-07-16 ENCOUNTER — Encounter: Payer: Self-pay | Admitting: Obstetrics

## 2018-07-16 ENCOUNTER — Ambulatory Visit (INDEPENDENT_AMBULATORY_CARE_PROVIDER_SITE_OTHER): Payer: BLUE CROSS/BLUE SHIELD | Admitting: Obstetrics

## 2018-07-16 VITALS — BP 128/84 | HR 79 | Ht 61.0 in | Wt 188.0 lb

## 2018-07-16 DIAGNOSIS — Z113 Encounter for screening for infections with a predominantly sexual mode of transmission: Secondary | ICD-10-CM | POA: Diagnosis not present

## 2018-07-16 DIAGNOSIS — Z30011 Encounter for initial prescription of contraceptive pills: Secondary | ICD-10-CM

## 2018-07-16 DIAGNOSIS — Z3009 Encounter for other general counseling and advice on contraception: Secondary | ICD-10-CM

## 2018-07-16 DIAGNOSIS — Z0000 Encounter for general adult medical examination without abnormal findings: Secondary | ICD-10-CM

## 2018-07-16 DIAGNOSIS — Z124 Encounter for screening for malignant neoplasm of cervix: Secondary | ICD-10-CM

## 2018-07-16 DIAGNOSIS — F4310 Post-traumatic stress disorder, unspecified: Secondary | ICD-10-CM

## 2018-07-16 DIAGNOSIS — N898 Other specified noninflammatory disorders of vagina: Secondary | ICD-10-CM | POA: Diagnosis not present

## 2018-07-16 DIAGNOSIS — Z01419 Encounter for gynecological examination (general) (routine) without abnormal findings: Secondary | ICD-10-CM | POA: Diagnosis not present

## 2018-07-16 MED ORDER — ALPRAZOLAM 0.25 MG PO TABS: 0.2500 mg | ORAL_TABLET | Freq: Every evening | ORAL | 0 refills | Status: DC | PRN

## 2018-07-16 MED ORDER — ESCITALOPRAM OXALATE 10 MG PO TABS: 10.0000 mg | ORAL_TABLET | Freq: Every day | ORAL | 5 refills | Status: DC

## 2018-07-16 MED ORDER — LO LOESTRIN FE 1 MG-10 MCG / 10 MCG PO TABS: 1.0000 | ORAL_TABLET | Freq: Every day | ORAL | 4 refills | Status: DC

## 2018-07-16 MED ORDER — FLUCONAZOLE 150 MG PO TABS: 150.0000 mg | ORAL_TABLET | Freq: Once | ORAL | 2 refills | Status: AC

## 2018-07-16 NOTE — Progress Notes (Signed)
Subjective:        Mary Mora is a 27 y.o. female here for a routine exam.  Current complaints: Vaginal discharge and irritation, possible yeast infection.  Feeling significant anxiety from experiencing the traumatic death of a co-worker that was killed by her husband while at work, who then committed suicide. She is having a hard time with daily activities, and can't sleep.  Personal health questionnaire:  Is patient [name], have a family history of breast and/or ovarian cancer: no Is there a family history of uterine cancer diagnosed at age < 57, gastrointestinal cancer, urinary tract cancer, family member who is a Personnel officer syndrome-associated carrier: no Is the patient overweight and hypertensive, family history of diabetes, personal history of gestational diabetes, preeclampsia or PCOS: no Is patient over 43, have PCOS,  family history of premature CHD under age 71, diabetes, smoke, have hypertension or peripheral artery disease:  no At any time, has a partner hit, kicked or otherwise hurt or frightened you?: no Over the past 2 weeks, have you felt down, depressed or hopeless?: no Over the past 2 weeks, have you felt little interest or pleasure in doing things?:no   Gynecologic History Patient's last menstrual period was 06/21/2018. Contraception: none Last Pap: 2018. Results were: normal Last mammogram: n/a. Results were: n/a  Obstetric History OB History  Gravida Para Term Preterm AB Living  3 2 2   1 2   SAB TAB Ectopic Multiple Live Births    1   0 2    # Outcome Date GA Lbr Len/2nd Weight Sex Delivery Anes PTL Lv  3 Term 12/03/14 [redacted]w[redacted]d 08:13 / 00:03 6 lb 6.8 oz (2.915 kg) M Vag-Spont EPI  LIV     Birth Comments: WNL  2 TAB 02/22/11 [redacted]w[redacted]d       DEC     Birth Comments: no complications  1 Term 03/20/10 [redacted]w[redacted]d  7 lb 14 oz (3.572 kg) M Vag-Spont EPI N LIV    Past Medical History:  Diagnosis Date  . BV (bacterial vaginosis)   . Heart burn   . Infection    . No pertinent past medical history     Past Surgical History:  Procedure Laterality Date  . ESOPHAGEAL MANOMETRY N/A 06/28/2017   Procedure: ESOPHAGEAL MANOMETRY (EM);  Surgeon: Kathi Der, MD;  Location: [facility] END[facility];  Service: Gastroenterology;  Laterality: N/A;     Current Outpatient Medications:  .  ALPRAZolam (XANAX) 0.25 MG tablet, Take 1 tablet (0.25 mg total) by mouth at bedtime as needed for anxiety., Disp: 30 tablet, Rfl: 0 .  benzonatate (TESSALON) 100 MG capsule, Take 1 capsule (100 mg total) by mouth every 8 (eight) hours. (Patient not taking: Reported on 07/16/2018), Disp: 21 capsule, Rfl: 0 .  escitalopram (LEXAPRO) 10 MG tablet, Take 1 tablet (10 mg total) by mouth daily., Disp: 30 tablet, Rfl: 5 .  esomeprazole (NEXIUM) 40 MG capsule, Take 1 capsule (40 mg total) by mouth daily. (Patient not taking: Reported on 07/16/2018), Disp: 30 capsule, Rfl: 0 .  fluticasone (FLONASE) 50 MCG/ACT nasal spray, Place 1 spray into both nostrils daily. (Patient not taking: Reported on 07/16/2018), Disp: 16 g, Rfl: 2 .  LO LOESTRIN FE 1 MG-10 MCG / 10 MCG tablet, Take 1 tablet by mouth daily., Disp: 3 Package, Rfl: 4 .  phentermine 37.5 MG capsule, Take 1 capsule (37.5 mg total) by mouth every morning. (Patient not taking: Reported on 07/16/2018), Disp: 30 capsule, Rfl: 2 No Known Allergies  Social History   Tobacco Use  . Smoking status: Never Smoker  . Smokeless tobacco: Never Used  Substance Use Topics  . Alcohol use: No    Family History  Problem Relation Age of Onset  . Hypertension Mother   . Diabetes Maternal Grandmother   . Kidney disease Maternal Grandmother        on dialysis      Review of Systems  Constitutional: negative for fatigue and weight loss Respiratory: negative for cough and wheezing Cardiovascular: negative for chest pain, fatigue and palpitations Gastrointestinal: negative for abdominal pain and change in bowel habits Musculoskeletal:negative for  myalgias Neurological: negative for gait problems and tremors Behavioral/Psych: negative for abusive relationship, depression Endocrine: negative for temperature intolerance    Genitourinary:negative for abnormal menstrual periods, genital lesions, hot flashes, sexual problems and vaginal discharge Integument/breast: negative for breast lump, breast tenderness, nipple discharge and skin lesion(s)    Objective:       BP 128/84   Pulse 79   Ht 5\' 1"  (1.549 m)   Wt 188 lb (85.3 kg)   LMP 06/21/2018   BMI 35.52 kg/m  General:   alert  Skin:   no rash or abnormalities  Lungs:   clear to auscultation bilaterally  Heart:   regular rate and rhythm, S1, S2 normal, no murmur, click, rub or gallop  Breasts:   normal without suspicious masses, skin or nipple changes or axillary nodes  Abdomen:  normal findings: no organomegaly, soft, non-tender and no hernia  Pelvis:  External genitalia: normal general appearance Urinary system: urethral meatus normal and bladder without fullness, nontender Vaginal: normal without tenderness, induration or masses Cervix: normal appearance Adnexa: normal bimanual exam Uterus: anteverted and non-tender, normal size   Lab Review Urine pregnancy test Labs reviewed yes Radiologic studies reviewed no  50% of 20 min visit spent on counseling and coordination of care.   Assessment:     1. Annual physical exam - doing well  2. Screening for cervical cancer Rx: - Cytology - PAP  3. Vaginal discharge Rx: - Cervicovaginal ancillary only - fluconazole (DIFLUCAN) 150 MG tablet; Take 1 tablet (150 mg total) by mouth once for 1 dose.  Dispense: 1 tablet; Refill: 2  4. PTSD (post-traumatic stress disorder) Rx: - ALPRAZolam (XANAX) 0.25 MG tablet; Take 1 tablet (0.25 mg total) by mouth at bedtime as needed for anxiety.  Dispense: 30 tablet; Refill: 0 - escitalopram (LEXAPRO) 10 MG tablet; Take 1 tablet (10 mg total) by mouth daily.  Dispense: 30 tablet;  Refill: 5  5. Routine adult health maintenance Rx: - Ambulatory referral to Internal Medicine  6. Encounter for other general counseling and advice on contraception - wants OCP's:  7. Encounter for initial prescription of contraceptive pills Rx - LO LOESTRIN FE 1 MG-10 MCG / 10 MCG tablet; Take 1 tablet by mouth daily.  Dispense: 3 Package; Refill: 4   Plan:    Education reviewed: calcium supplements, depression evaluation, low fat, low cholesterol diet, safe sex/STD prevention, self breast exams and weight bearing exercise. Contraception: OCP (estrogen/progesterone). Follow up in: 1 year.   Meds ordered this encounter  Medications  . ALPRAZolam (XANAX) 0.25 MG tablet    Sig: Take 1 tablet (0.25 mg total) by mouth at bedtime as needed for anxiety.    Dispense:  30 tablet    Refill:  0  . escitalopram (LEXAPRO) 10 MG tablet    Sig: Take 1 tablet (10 mg total) by mouth daily.  Dispense:  30 tablet    Refill:  5  . LO LOESTRIN FE 1 MG-10 MCG / 10 MCG tablet    Sig: Take 1 tablet by mouth daily.    Dispense:  3 Package    Refill:  4    Submit other coverage code 3  BIN:  [id number]  PCN:  CN   GRP:  [id number]   ID:  [id number]   Orders Placed This Encounter  Procedures  . Ambulatory referral to Internal Medicine    Referral Priority:   Routine    Referral Type:   Consultation    Referral Reason:   Specialty Services Required    Requested Specialty:   Internal Medicine    Number of Visits Requested:   1    Brock Bad MD 07-16-2018

## 2018-07-16 NOTE — Progress Notes (Signed)
Patient presents for her Annual Exam   LMP: 06/21/2018 Last pap: 12/05/2016.  Contraception:None  STD Testing: Declines   C/O: vaginal discharge possible yeast infection.

## 2018-07-17 LAB — CERVICOVAGINAL ANCILLARY ONLY
Bacterial vaginitis: NEGATIVE
CANDIDA VAGINITIS: NEGATIVE
CHLAMYDIA, DNA PROBE: NEGATIVE
NEISSERIA GONORRHEA: NEGATIVE
Trichomonas: NEGATIVE

## 2018-07-18 LAB — CYTOLOGY - PAP: DIAGNOSIS: NEGATIVE

## 2018-09-04 DIAGNOSIS — K220 Achalasia of cardia: Secondary | ICD-10-CM | POA: Diagnosis not present

## 2018-09-04 DIAGNOSIS — D649 Anemia, unspecified: Secondary | ICD-10-CM | POA: Diagnosis not present

## 2018-09-04 DIAGNOSIS — K228 Other specified diseases of esophagus: Secondary | ICD-10-CM | POA: Diagnosis not present

## 2018-09-04 DIAGNOSIS — K2210 Ulcer of esophagus without bleeding: Secondary | ICD-10-CM | POA: Diagnosis not present

## 2018-10-30 IMAGING — CR DG CHEST 2V
2 series · 2 of 2 positions shown · non-contrast
Comparison: None.

CLINICAL DATA: Productive cough, shortness of breath, chest pain

EXAM:
CHEST  2 VIEW

[w chest pa]
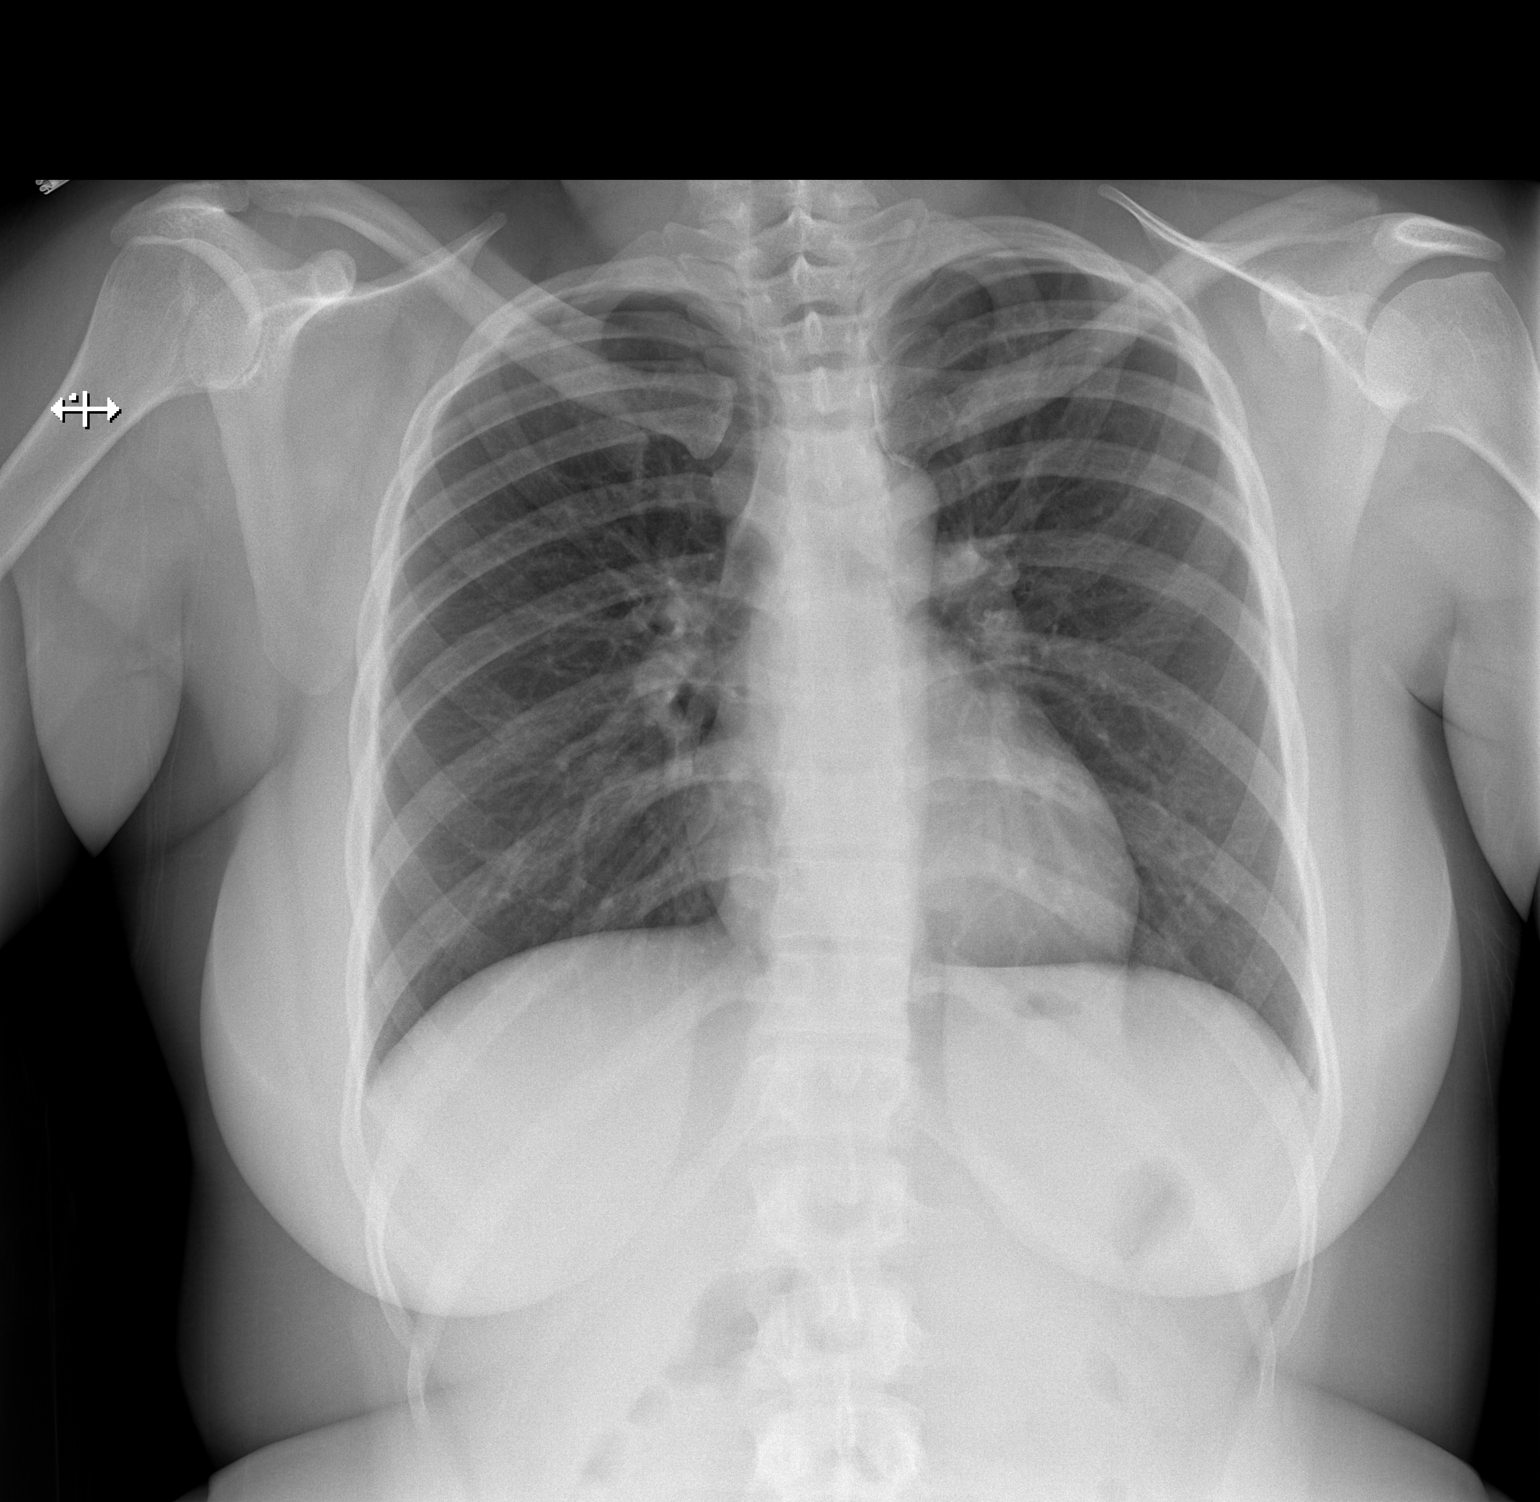

[w chest lat]
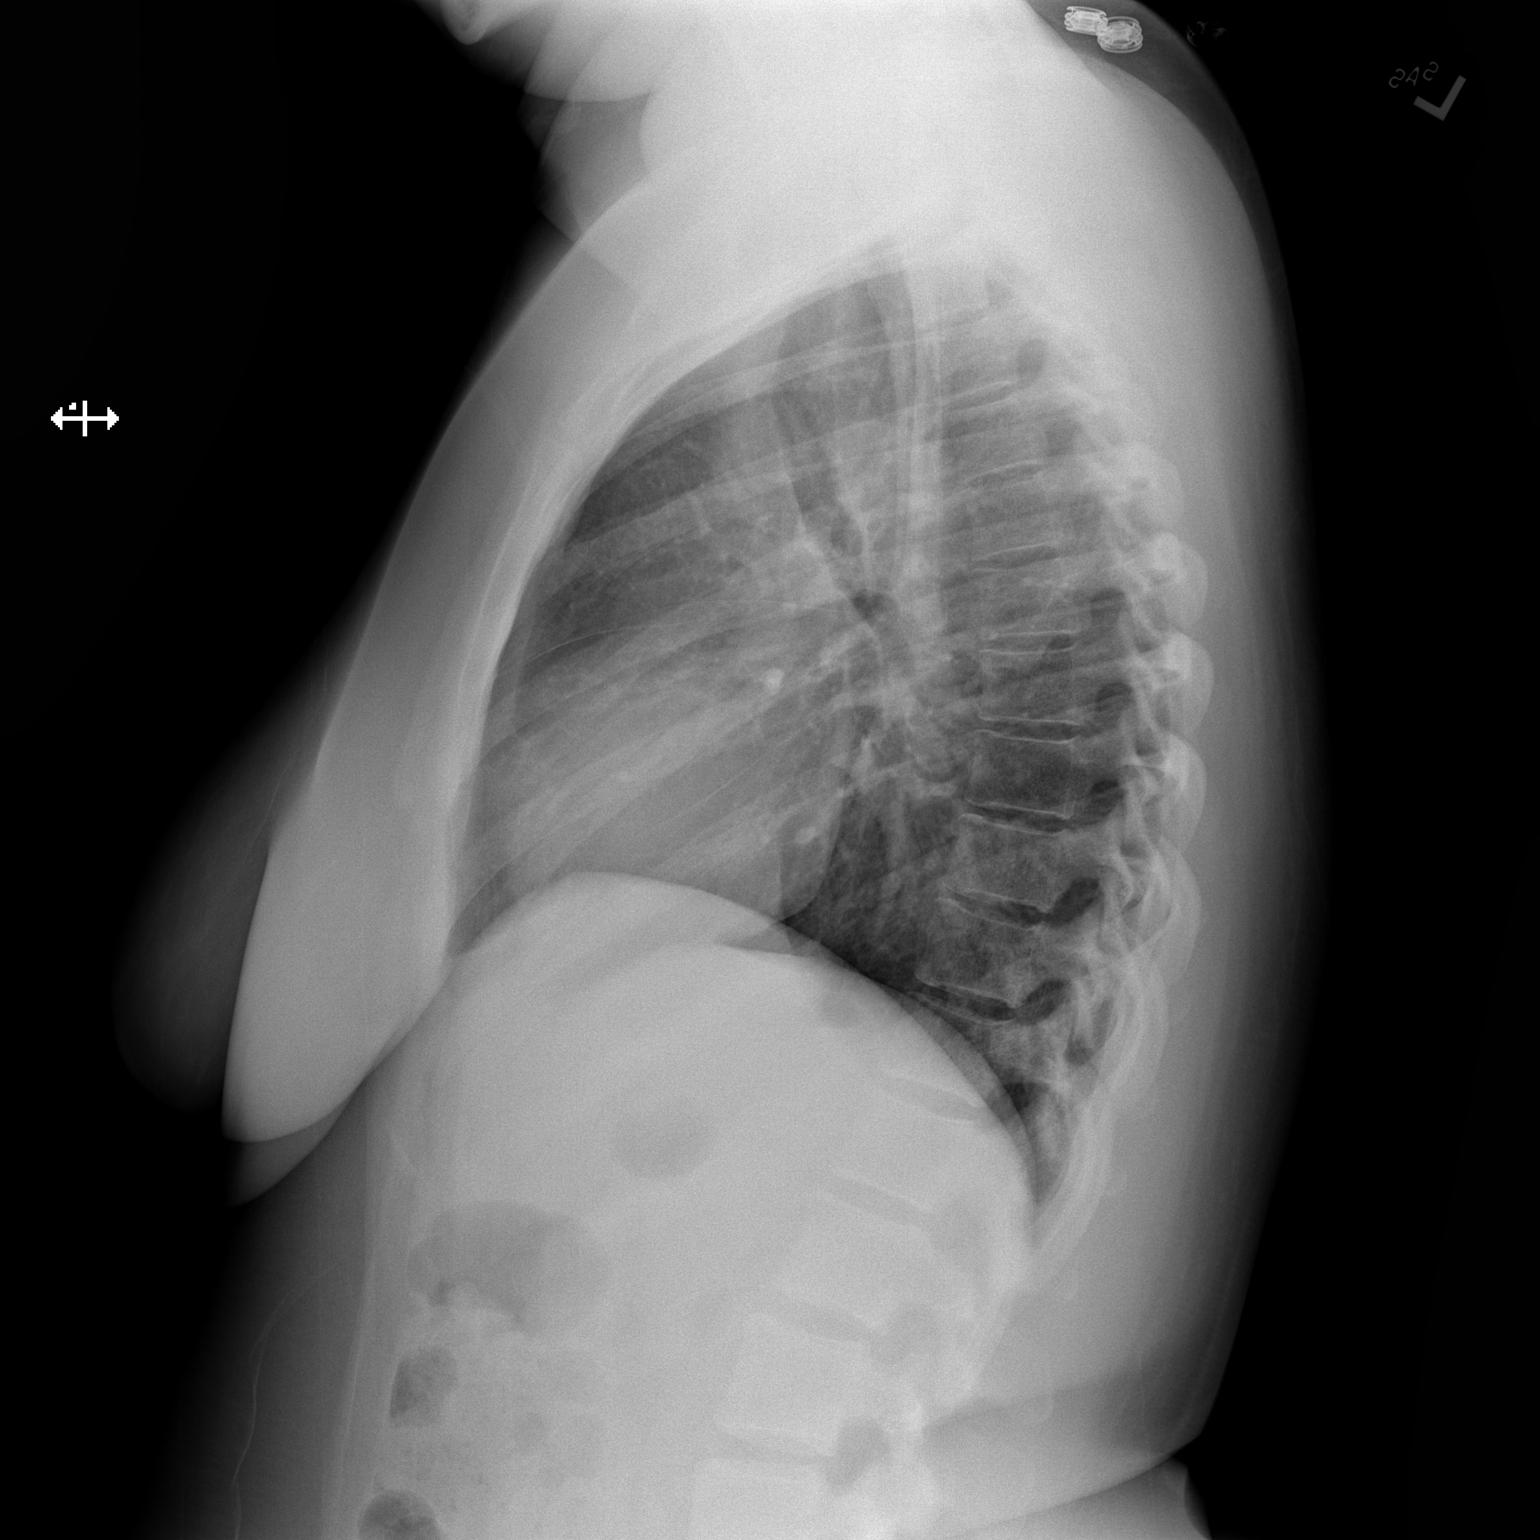

[2 of 2 positions shown; findings below may reference images not displayed]

FINDINGS: Heart and mediastinal contours are within normal limits. No focal
opacities or effusions. No acute bony abnormality.
IMPRESSION: No active cardiopulmonary disease.

## 2019-03-13 DIAGNOSIS — H9209 Otalgia, unspecified ear: Secondary | ICD-10-CM | POA: Diagnosis not present

## 2020-05-06 ENCOUNTER — Other Ambulatory Visit: Payer: Self-pay

## 2020-05-06 ENCOUNTER — Encounter: Payer: Self-pay | Admitting: Obstetrics

## 2020-05-06 ENCOUNTER — Ambulatory Visit (INDEPENDENT_AMBULATORY_CARE_PROVIDER_SITE_OTHER): Payer: BC Managed Care – PPO | Admitting: Obstetrics

## 2020-05-06 ENCOUNTER — Other Ambulatory Visit (HOSPITAL_COMMUNITY)
Admission: RE | Admit: 2020-05-06 | Discharge: 2020-05-06 | Disposition: A | Discharge status: Home / Self Care | Payer: BC Managed Care – PPO | Source: Ambulatory Visit | Attending: Obstetrics | Admitting: Obstetrics

## 2020-05-06 VITALS — BP 120/79 | HR 95 | Ht 61.0 in | Wt 204.6 lb

## 2020-05-06 DIAGNOSIS — N898 Other specified noninflammatory disorders of vagina: Secondary | ICD-10-CM | POA: Diagnosis not present

## 2020-05-06 DIAGNOSIS — Z01419 Encounter for gynecological examination (general) (routine) without abnormal findings: Secondary | ICD-10-CM | POA: Insufficient documentation

## 2020-05-06 NOTE — Progress Notes (Signed)
Pt is in the office for annual. Last pap 07-16-18 Pt declined any birth control today. Pt reports that white discharge, itching and odor. Pt states that symptoms usually happen after her cycle. LMP 04-21-20. GAD-7= 7

## 2020-05-06 NOTE — Progress Notes (Signed)
Subjective:        Mary Mora is a 29 y.o. female here for a routine exam.  Current complaints: Thick white vaginal discharge, itching and slight odor.  Used Monistat last week.  These symptoms usually occur after the period.  Personal health questionnaire:  Is patient Mary Mora, have a family history of breast and/or ovarian cancer: no Is there a family history of uterine cancer diagnosed at age < 50, gastrointestinal cancer, urinary tract cancer, family member who is a Personnel officer syndrome-associated carrier: no Is the patient overweight and hypertensive, family history of diabetes, personal history of gestational diabetes, preeclampsia or PCOS: no Is patient over 83, have PCOS,  family history of premature CHD under age 68, diabetes, smoke, have hypertension or peripheral artery disease:  no At any time, has a partner hit, kicked or otherwise hurt or frightened you?: no Over the past 2 weeks, have you felt down, depressed or hopeless?: no Over the past 2 weeks, have you felt little interest or pleasure in doing things?:no   Gynecologic History Patient's last menstrual period was 04/21/2020. Contraception: IUD Last Pap: 07-16-2018. Results were: normal Last mammogram: n/a. Results were: n/a  Obstetric History OB History  Gravida Para Term Preterm AB Living  3 2 2   1 2   SAB TAB Ectopic Multiple Live Births    1   0 2    # Outcome Date GA Lbr Len/2nd Weight Sex Delivery Anes PTL Lv  3 Term 12/03/14 [redacted]w[redacted]d 08:13 / 00:03 6 lb 6.8 oz (2.915 kg) M Vag-Spont EPI  LIV     Birth Comments: WNL  2 TAB 02/22/11 [redacted]w[redacted]d       DEC     Birth Comments: no complications  1 Term 03/20/10 [redacted]w[redacted]d  7 lb 14 oz (3.572 kg) M Vag-Spont EPI N LIV    Past Medical History:  Diagnosis Date  . BV (bacterial vaginosis)   . Heart burn   . Infection   . No pertinent past medical history     Past Surgical History:  Procedure Laterality Date  . ESOPHAGEAL MANOMETRY N/A 06/28/2017   Procedure:  ESOPHAGEAL MANOMETRY (EM);  Surgeon: 08/28/2017, MD;  Location: [facility];  Service: Gastroenterology;  Laterality: N/A;     Current Outpatient Medications:  .  ALPRAZolam (XANAX) 0.25 MG tablet, Take 1 tablet (0.25 mg total) by mouth at bedtime as needed for anxiety. (Patient not taking: Reported on 05/06/2020), Disp: 30 tablet, Rfl: 0 .  benzonatate (TESSALON) 100 MG capsule, Take 1 capsule (100 mg total) by mouth every 8 (eight) hours. (Patient not taking: Reported on 07/16/2018), Disp: 21 capsule, Rfl: 0 .  escitalopram (LEXAPRO) 10 MG tablet, Take 1 tablet (10 mg total) by mouth daily. (Patient not taking: Reported on 05/06/2020), Disp: 30 tablet, Rfl: 5 .  esomeprazole (NEXIUM) 40 MG capsule, Take 1 capsule (40 mg total) by mouth daily. (Patient not taking: Reported on 07/16/2018), Disp: 30 capsule, Rfl: 0 .  fluticasone (FLONASE) 50 MCG/ACT nasal spray, Place 1 spray into both nostrils daily. (Patient not taking: Reported on 07/16/2018), Disp: 16 g, Rfl: 2 .  LO LOESTRIN FE 1 MG-10 MCG / 10 MCG tablet, Take 1 tablet by mouth daily. (Patient not taking: Reported on 05/06/2020), Disp: 3 Package, Rfl: 4 .  phentermine 37.5 MG capsule, Take 1 capsule (37.5 mg total) by mouth every morning. (Patient not taking: Reported on 07/16/2018), Disp: 30 capsule, Rfl: 2 No Known Allergies  Social History  Tobacco Use  . Smoking status: Never Smoker  . Smokeless tobacco: Never Used  Substance Use Topics  . Alcohol use: No    Family History  Problem Relation Age of Onset  . Hypertension Mother   . Diabetes Maternal Grandmother   . Kidney disease Maternal Grandmother        on dialysis      Review of Systems  Constitutional: negative for fatigue and weight loss Respiratory: negative for cough and wheezing Cardiovascular: negative for chest pain, fatigue and palpitations Gastrointestinal: negative for abdominal pain and change in bowel habits Musculoskeletal:negative for  myalgias Neurological: negative for gait problems and tremors Behavioral/Psych: negative for abusive relationship, depression Endocrine: negative for temperature intolerance    Genitourinary:negative for abnormal menstrual periods, genital lesions, hot flashes, sexual problems. Positive for vaginal discharge with itching and slight odor Integument/breast: negative for breast lump, breast tenderness, nipple discharge and skin lesion(s)    Objective:       BP 120/79   Pulse 95   Ht 5\' 1"  (1.549 m)   Wt 204 lb 9.6 oz (92.8 kg)   LMP 04/21/2020   Breastfeeding No   BMI 38.66 kg/m  General:   alert and no distress  Skin:   no rash or abnormalities  Lungs:   clear to auscultation bilaterally  Heart:   regular rate and rhythm, S1, S2 normal, no murmur, click, rub or gallop  Breasts:   normal without suspicious masses, skin or nipple changes or axillary nodes  Abdomen:  normal findings: no organomegaly, soft, non-tender and no hernia  Pelvis:  External genitalia: normal general appearance Urinary system: urethral meatus normal and bladder without fullness, nontender Vaginal: normal without tenderness, induration or masses.   Cervix: normal appearance Adnexa: normal bimanual exam Uterus: anteverted and non-tender, normal size   Lab Review Urine pregnancy test Labs reviewed yes Radiologic studies reviewed yes  50% of 20 min visit spent on counseling and coordination of care.   Assessment:     1. Encounter for gynecological examination with Papanicolaou smear of cervix Rx: - Cytology - PAP( [place])  2. Vaginal discharge Rx: - Cervicovaginal ancillary only( [place])  3. Vaginal irritation Rx: - Cervicovaginal ancillary only( [place])    Plan:    Education reviewed: calcium supplements, depression evaluation, low fat, low cholesterol diet, safe sex/STD prevention, self breast exams and weight bearing exercise. Contraception: IUD. Follow up in: 1 year.      04/23/2020, MD 05/06/2020 3:58 PM

## 2020-05-08 LAB — CERVICOVAGINAL ANCILLARY ONLY
Bacterial Vaginitis (gardnerella): POSITIVE — AB
Candida Glabrata: NEGATIVE
Candida Vaginitis: NEGATIVE
Chlamydia: NEGATIVE
Comment: NEGATIVE
Comment: NEGATIVE
Comment: NEGATIVE
Comment: NEGATIVE
Comment: NEGATIVE
Comment: NORMAL
Neisseria Gonorrhea: NEGATIVE
Trichomonas: NEGATIVE

## 2020-05-08 LAB — CYTOLOGY - PAP: Diagnosis: NEGATIVE

## 2020-05-09 ENCOUNTER — Other Ambulatory Visit: Payer: Self-pay | Admitting: Obstetrics

## 2020-05-09 DIAGNOSIS — B9689 Other specified bacterial agents as the cause of diseases classified elsewhere: Secondary | ICD-10-CM

## 2020-05-09 DIAGNOSIS — N760 Acute vaginitis: Secondary | ICD-10-CM

## 2020-05-09 MED ORDER — METRONIDAZOLE 500 MG PO TABS: 500.0000 mg | ORAL_TABLET | Freq: Two times a day (BID) | ORAL | 2 refills | Status: DC

## 2020-06-16 ENCOUNTER — Other Ambulatory Visit: Payer: BC Managed Care – PPO

## 2020-06-17 DIAGNOSIS — Z20822 Contact with and (suspected) exposure to covid-19: Secondary | ICD-10-CM | POA: Diagnosis not present

## 2020-09-08 DIAGNOSIS — K220 Achalasia of cardia: Secondary | ICD-10-CM | POA: Diagnosis not present

## 2021-05-28 ENCOUNTER — Ambulatory Visit (INDEPENDENT_AMBULATORY_CARE_PROVIDER_SITE_OTHER): Payer: BC Managed Care – PPO | Admitting: Obstetrics

## 2021-05-28 ENCOUNTER — Encounter: Payer: Self-pay | Admitting: Obstetrics

## 2021-05-28 ENCOUNTER — Other Ambulatory Visit: Payer: Self-pay

## 2021-05-28 ENCOUNTER — Other Ambulatory Visit (HOSPITAL_COMMUNITY)
Admission: RE | Admit: 2021-05-28 | Discharge: 2021-05-28 | Disposition: A | Discharge status: Home / Self Care | Payer: BC Managed Care – PPO | Source: Ambulatory Visit | Attending: Obstetrics | Admitting: Obstetrics

## 2021-05-28 VITALS — BP 120/78 | HR 68 | Ht 61.0 in | Wt 193.7 lb

## 2021-05-28 DIAGNOSIS — Z113 Encounter for screening for infections with a predominantly sexual mode of transmission: Secondary | ICD-10-CM | POA: Diagnosis not present

## 2021-05-28 DIAGNOSIS — N946 Dysmenorrhea, unspecified: Secondary | ICD-10-CM

## 2021-05-28 DIAGNOSIS — Z01419 Encounter for gynecological examination (general) (routine) without abnormal findings: Secondary | ICD-10-CM | POA: Insufficient documentation

## 2021-05-28 DIAGNOSIS — N898 Other specified noninflammatory disorders of vagina: Secondary | ICD-10-CM

## 2021-05-28 MED ORDER — IBUPROFEN 800 MG PO TABS: 800.0000 mg | ORAL_TABLET | Freq: Three times a day (TID) | ORAL | 5 refills | Status: DC | PRN

## 2021-05-28 NOTE — Progress Notes (Signed)
Subjective:        Mary Mora is a 30 y.o. female here for a routine exam.  Current complaints: Vaginal discharge and irritation.    Personal health questionnaire:  Is patient [name], have a family history of breast and/or ovarian cancer: no Is there a family history of uterine cancer diagnosed at age < 78, gastrointestinal cancer, urinary tract cancer, family member who is a Personnel officer syndrome-associated carrier: no Is the patient overweight and hypertensive, family history of diabetes, personal history of gestational diabetes, preeclampsia or PCOS: no Is patient over 59, have PCOS,  family history of premature CHD under age 68, diabetes, smoke, have hypertension or peripheral artery disease:  no At any time, has a partner hit, kicked or otherwise hurt or frightened you?: no Over the past 2 weeks, have you felt down, depressed or hopeless?: no Over the past 2 weeks, have you felt little interest or pleasure in doing things?:no   Gynecologic History Patient's last menstrual period was 05/11/2021. Contraception: condoms Last Pap: 05-06-2020. Results were: normal Last mammogram: n/a. Results were: n/a  Obstetric History OB History  Gravida Para Term Preterm AB Living  3 2 2   1 2   SAB IAB Ectopic Multiple Live Births    1   0 2    # Outcome Date GA Lbr Len/2nd Weight Sex Delivery Anes PTL Lv  3 Term 12/03/14 [redacted]w[redacted]d 08:13 / 00:03 6 lb 6.8 oz (2.915 kg) M Vag-Spont EPI  LIV     Birth Comments: WNL  2 IAB 02/22/11 [redacted]w[redacted]d       DEC     Birth Comments: no complications  1 Term 03/20/10 [redacted]w[redacted]d  7 lb 14 oz (3.572 kg) M Vag-Spont EPI N LIV    Past Medical History:  Diagnosis Date   BV (bacterial vaginosis)    Heart burn    Infection    No pertinent past medical history     Past Surgical History:  Procedure Laterality Date   ESOPHAGEAL MANOMETRY N/A 06/28/2017   Procedure: ESOPHAGEAL MANOMETRY (EM);  Surgeon: 08/28/2017, MD;  Location: [facility];  Service:  Gastroenterology;  Laterality: N/A;     Current Outpatient Medications:    ibuprofen (ADVIL) 800 MG tablet, Take 1 tablet (800 mg total) by mouth every 8 (eight) hours as needed., Disp: 30 tablet, Rfl: 5   ALPRAZolam (XANAX) 0.25 MG tablet, Take 1 tablet (0.25 mg total) by mouth at bedtime as needed for anxiety., Disp: 30 tablet, Rfl: 0   benzonatate (TESSALON) 100 MG capsule, Take 1 capsule (100 mg total) by mouth every 8 (eight) hours., Disp: 21 capsule, Rfl: 0   escitalopram (LEXAPRO) 10 MG tablet, Take 1 tablet (10 mg total) by mouth daily., Disp: 30 tablet, Rfl: 5   esomeprazole (NEXIUM) 40 MG capsule, Take 1 capsule (40 mg total) by mouth daily., Disp: 30 capsule, Rfl: 0   fluticasone (FLONASE) 50 MCG/ACT nasal spray, Place 1 spray into both nostrils daily., Disp: 16 g, Rfl: 2   LO LOESTRIN FE 1 MG-10 MCG / 10 MCG tablet, Take 1 tablet by mouth daily., Disp: 3 Package, Rfl: 4   metroNIDAZOLE (FLAGYL) 500 MG tablet, Take 1 tablet (500 mg total) by mouth 2 (two) times daily., Disp: 14 tablet, Rfl: 2   phentermine 37.5 MG capsule, Take 1 capsule (37.5 mg total) by mouth every morning., Disp: 30 capsule, Rfl: 2 No Known Allergies  Social History   Tobacco Use   Smoking status: Never  Smokeless tobacco: Never  Substance Use Topics   Alcohol use: No    Family History  Problem Relation Age of Onset   Hypertension Mother    Diabetes Maternal Grandmother    Kidney disease Maternal Grandmother        on dialysis      Review of Systems  Constitutional: negative for fatigue and weight loss Respiratory: negative for cough and wheezing Cardiovascular: negative for chest pain, fatigue and palpitations Gastrointestinal: negative for abdominal pain and change in bowel habits Musculoskeletal:negative for myalgias Neurological: negative for gait problems and tremors Behavioral/Psych: negative for abusive relationship, depression Endocrine: negative for temperature intolerance     Genitourinary:positive for vaginal discharge and irritation.  negative for abnormal menstrual periods, genital lesions, hot flashes, sexual problems  Integument/breast: negative for breast lump, breast tenderness, nipple discharge and skin lesion(s)    Objective:       BP 120/78   Pulse 68   Ht 5\' 1"  (1.549 m)   Wt 193 lb 11.2 oz (87.9 kg)   LMP 05/11/2021   BMI 36.60 kg/m  General:   Alert and no distress  Skin:   no rash or abnormalities  Lungs:   clear to auscultation bilaterally  Heart:   regular rate and rhythm, S1, S2 normal, no murmur, click, rub or gallop  Breasts:   normal without suspicious masses, skin or nipple changes or axillary nodes  Abdomen:  normal findings: no organomegaly, soft, non-tender and no hernia  Pelvis:  External genitalia: normal general appearance Urinary system: urethral meatus normal and bladder without fullness, nontender Vaginal: normal without tenderness, induration or masses Cervix: normal appearance Adnexa: normal bimanual exam Uterus: anteverted and non-tender, normal size   Lab Review Urine pregnancy test Labs reviewed yes Radiologic studies reviewed no  Assessment:    1. Encounter for routine gynecological examination with Papanicolaou smear of cervix Rx: - Cytology - PAP( [place])  2. Vaginal discharge Rx: - Cervicovaginal ancillary only  3. Screening for STD (sexually transmitted disease) Rx: - Hepatitis B surface antigen - HIV Antibody (routine testing w rflx) - RPR - Hepatitis C antibody  4. Dysmenorrhea Rx: - ibuprofen (ADVIL) 800 MG tablet; Take 1 tablet (800 mg total) by mouth every 8 (eight) hours as needed.  Dispense: 30 tablet; Refill: 5 Rx   5. Contraceptive management - declines hormonal contraception - using condoms   Plan:    Education reviewed: calcium supplements, depression evaluation, low fat, low cholesterol diet, safe sex/STD prevention, self breast exams, and weight bearing  exercise. Contraception: condoms. Follow up in: 1 year.    Orders Placed This Encounter  Procedures   Hepatitis B surface antigen   HIV Antibody (routine testing w rflx)   RPR   Hepatitis C antibody         05/13/2021, MD 05/28/2021 11:09 AM

## 2021-05-28 NOTE — Progress Notes (Signed)
Pt presents for annual, pap, and all STD testing. Pt c/o vaginal irritation and vaginal discharge.  Last pap normal 07/16/18

## 2021-05-29 LAB — HEPATITIS C ANTIBODY: Hep C Virus Ab: <0.1 s/co ratio (ref 0.0–0.9)

## 2021-05-29 LAB — RPR: RPR Ser Ql: NONREACTIVE

## 2021-05-29 LAB — HEPATITIS B SURFACE ANTIGEN: Hepatitis B Surface Ag: NEGATIVE

## 2021-05-29 LAB — HIV ANTIBODY (ROUTINE TESTING W REFLEX): HIV Screen 4th Generation wRfx: NONREACTIVE

## 2021-05-30 LAB — CERVICOVAGINAL ANCILLARY ONLY
Bacterial Vaginitis (gardnerella): NEGATIVE
Candida Glabrata: NEGATIVE
Candida Vaginitis: NEGATIVE
Chlamydia: NEGATIVE
Comment: NEGATIVE
Comment: NEGATIVE
Comment: NEGATIVE
Comment: NEGATIVE
Comment: NEGATIVE
Comment: NORMAL
Neisseria Gonorrhea: NEGATIVE
Trichomonas: POSITIVE — AB

## 2021-05-31 ENCOUNTER — Other Ambulatory Visit: Payer: Self-pay | Admitting: Obstetrics

## 2021-05-31 DIAGNOSIS — A5901 Trichomonal vulvovaginitis: Secondary | ICD-10-CM

## 2021-05-31 MED ORDER — METRONIDAZOLE 500 MG PO TABS: 500.0000 mg | ORAL_TABLET | Freq: Two times a day (BID) | ORAL | 2 refills | Status: DC

## 2021-06-01 LAB — CYTOLOGY - PAP
Comment: NEGATIVE
Diagnosis: NEGATIVE
High risk HPV: NEGATIVE

## 2021-09-14 DIAGNOSIS — Z09 Encounter for follow-up examination after completed treatment for conditions other than malignant neoplasm: Secondary | ICD-10-CM | POA: Diagnosis not present

## 2021-09-14 DIAGNOSIS — K220 Achalasia of cardia: Secondary | ICD-10-CM | POA: Diagnosis not present

## 2021-09-14 DIAGNOSIS — Z79899 Other long term (current) drug therapy: Secondary | ICD-10-CM | POA: Diagnosis not present

## 2021-09-14 DIAGNOSIS — R12 Heartburn: Secondary | ICD-10-CM | POA: Diagnosis not present

## 2021-09-14 DIAGNOSIS — Z8719 Personal history of other diseases of the digestive system: Secondary | ICD-10-CM | POA: Diagnosis not present

## 2021-09-14 DIAGNOSIS — K2100 Gastro-esophageal reflux disease with esophagitis, without bleeding: Secondary | ICD-10-CM | POA: Diagnosis not present

## 2021-12-02 DIAGNOSIS — R7303 Prediabetes: Secondary | ICD-10-CM | POA: Diagnosis not present

## 2021-12-09 DIAGNOSIS — R7303 Prediabetes: Secondary | ICD-10-CM | POA: Diagnosis not present

## 2022-01-15 DIAGNOSIS — R7303 Prediabetes: Secondary | ICD-10-CM | POA: Diagnosis not present

## 2022-03-11 ENCOUNTER — Telehealth: Payer: BC Managed Care – PPO | Admitting: Emergency Medicine

## 2022-03-11 DIAGNOSIS — N760 Acute vaginitis: Secondary | ICD-10-CM | POA: Diagnosis not present

## 2022-03-11 MED ORDER — FLUCONAZOLE 150 MG PO TABS: 150.0000 mg | ORAL_TABLET | Freq: Once | ORAL | 0 refills | Status: AC

## 2022-03-11 MED ORDER — METRONIDAZOLE 500 MG PO TABS: 500.0000 mg | ORAL_TABLET | Freq: Two times a day (BID) | ORAL | 0 refills | Status: DC

## 2022-03-11 NOTE — Progress Notes (Signed)
E-Visit for Vaginal Symptoms  We are sorry that you are not feeling well. Here is how we plan to help! Based on what you shared with me it looks like you: May have a vaginosis due to bacteria  Vaginosis is an inflammation of the vagina that can result in discharge, itching and pain. The cause is usually a change in the normal balance of vaginal bacteria or an infection. Vaginosis can also result from reduced estrogen levels after menopause.  The most common causes of vaginosis are:   Bacterial vaginosis which results from an overgrowth of one on several organisms that are normally present in your vagina.   Yeast infections which are caused by a naturally occurring fungus called candida.   Vaginal atrophy (atrophic vaginosis) which results from the thinning of the vagina from reduced estrogen levels after menopause.   Trichomoniasis which is caused by a parasite and is commonly transmitted by sexual intercourse.  Factors that increase your risk of developing vaginosis include: Medications, such as antibiotics and steroids Uncontrolled diabetes Use of hygiene products such as bubble bath, vaginal spray or vaginal deodorant Douching Wearing damp or tight-fitting clothing Using an intrauterine device (IUD) for birth control Hormonal changes, such as those associated with pregnancy, birth control pills or menopause Sexual activity Having a sexually transmitted infection  Your treatment plan is Metronidazole or Flagyl 500mg  twice a day for 7 days.  I have electronically sent this prescription into the pharmacy that you have chosen. I've also sent 1 tablet of Diflucan in case you develop a yeast infection with the antibiotic.  Be sure to take all of the medication as directed. Stop taking any medication if you develop a rash, tongue swelling or shortness of breath. Mothers who are breast feeding should consider pumping and discarding their breast milk while on these antibiotics. However, there  is no consensus that infant exposure at these doses would be harmful.  Remember that medication creams can weaken latex condoms.   HOME CARE:  Good hygiene may prevent some types of vaginosis from recurring and may relieve some symptoms:  Avoid baths, hot tubs and whirlpool spas. Rinse soap from your outer genital area after a shower, and dry the area well to prevent irritation. Don't use scented or harsh soaps, such as those with deodorant or antibacterial action. Avoid irritants. These include scented tampons and pads. Wipe from front to back after using the toilet. Doing so avoids spreading fecal bacteria to your vagina.  Other things that may help prevent vaginosis include:  Don't douche. Your vagina doesn't require cleansing other than normal bathing. Repetitive douching disrupts the normal organisms that reside in the vagina and can actually increase your risk of vaginal infection. Douching won't clear up a vaginal infection. Use a latex condom. Both female and female latex condoms may help you avoid infections spread by sexual contact. Wear cotton underwear. Also wear pantyhose with a cotton crotch. If you feel comfortable without it, skip wearing underwear to bed. Yeast thrives in [place] Your symptoms should improve in the next day or two.  GET HELP RIGHT AWAY IF:  You have pain in your lower abdomen ( pelvic area or over your ovaries) You develop nausea or vomiting You develop a fever Your discharge changes or worsens You have persistent pain with intercourse You develop shortness of breath, a rapid pulse, or you faint.  These symptoms could be signs of problems or infections that need to be evaluated by a medical provider now.  MAKE SURE YOU   Understand these instructions. Will watch your condition. Will get help right away if you are not doing well or get worse.  Thank you for choosing an e-visit.  Your e-visit answers were reviewed by a board certified  advanced clinical practitioner to complete your personal care plan. Depending upon the condition, your plan could have included both over the counter or prescription medications.  Please review your pharmacy choice. Make sure the pharmacy is open so you can pick up prescription now. If there is a problem, you may contact your provider through [employer] and have the prescription routed to another pharmacy.  Your safety is important to [place]. If you have drug allergies check your prescription carefully.   For the next 24 hours you can use MyChart to ask questions about today's visit, request a non-urgent call back, or ask for a work or school excuse. You will get an email in the next two days asking about your experience. I hope that your e-visit has been valuable and will speed your recovery.  Approximately 5 minutes was used in reviewing the patient's chart, questionnaire, prescribing medications, and documentation.

## 2022-04-16 DIAGNOSIS — J029 Acute pharyngitis, unspecified: Secondary | ICD-10-CM | POA: Diagnosis not present

## 2022-08-16 ENCOUNTER — Encounter: Payer: Self-pay | Admitting: Obstetrics

## 2022-08-16 ENCOUNTER — Ambulatory Visit (INDEPENDENT_AMBULATORY_CARE_PROVIDER_SITE_OTHER): Payer: BC Managed Care – PPO | Admitting: Obstetrics

## 2022-08-16 ENCOUNTER — Other Ambulatory Visit (HOSPITAL_COMMUNITY)
Admission: RE | Admit: 2022-08-16 | Discharge: 2022-08-16 | Disposition: A | Discharge status: Home / Self Care | Payer: BC Managed Care – PPO | Source: Ambulatory Visit | Attending: Obstetrics | Admitting: Obstetrics

## 2022-08-16 VITALS — BP 125/79 | HR 66 | Wt 194.7 lb

## 2022-08-16 DIAGNOSIS — N760 Acute vaginitis: Secondary | ICD-10-CM | POA: Diagnosis not present

## 2022-08-16 DIAGNOSIS — N898 Other specified noninflammatory disorders of vagina: Secondary | ICD-10-CM | POA: Diagnosis not present

## 2022-08-16 DIAGNOSIS — E6609 Other obesity due to excess calories: Secondary | ICD-10-CM

## 2022-08-16 DIAGNOSIS — Z113 Encounter for screening for infections with a predominantly sexual mode of transmission: Secondary | ICD-10-CM | POA: Diagnosis not present

## 2022-08-16 DIAGNOSIS — Z01419 Encounter for gynecological examination (general) (routine) without abnormal findings: Secondary | ICD-10-CM

## 2022-08-16 DIAGNOSIS — N946 Dysmenorrhea, unspecified: Secondary | ICD-10-CM | POA: Diagnosis not present

## 2022-08-16 DIAGNOSIS — B3731 Acute candidiasis of vulva and vagina: Secondary | ICD-10-CM | POA: Diagnosis not present

## 2022-08-16 MED ORDER — IBUPROFEN 800 MG PO TABS: 800.0000 mg | ORAL_TABLET | Freq: Three times a day (TID) | ORAL | 5 refills | Status: DC | PRN

## 2022-08-16 MED ORDER — PHENTERMINE HCL 37.5 MG PO CAPS: 37.5000 mg | ORAL_CAPSULE | ORAL | 2 refills | Status: DC

## 2022-08-16 NOTE — Progress Notes (Signed)
Subjective:        Mary Mora is a 31 y.o. female here for a routine exam.  Current complaints: Vaginal discharge.    Personal health questionnaire:  Is patient Mary Mora, have a family history of breast and/or ovarian cancer: no Is there a family history of uterine cancer diagnosed at age < 41, gastrointestinal cancer, urinary tract cancer, family member who is a Field seismologist syndrome-associated carrier: no Is the patient overweight and hypertensive, family history of diabetes, personal history of gestational diabetes, preeclampsia or PCOS: no Is patient over 11, have PCOS,  family history of premature CHD under age 71, diabetes, smoke, have hypertension or peripheral artery disease:  no At any time, has a partner hit, kicked or otherwise hurt or frightened you?: no Over the past 2 weeks, have you felt down, depressed or hopeless?: no Over the past 2 weeks, have you felt little interest or pleasure in doing things?:no   Gynecologic History Patient's last menstrual period was 08/05/2022. Contraception: none Last Pap: 2022. Results were: normal Last mammogram: n/a. Results were: n/a  Obstetric History OB History  Gravida Para Term Preterm AB Living  3 2 2   1 2   SAB IAB Ectopic Multiple Live Births    1   0 2    # Outcome Date GA Lbr Len/2nd Weight Sex Delivery Anes PTL Lv  3 Term 12/03/14 [redacted]w[redacted]d 08:13 / 00:03 6 lb 6.8 oz (2.915 kg) M Vag-Spont EPI  LIV     Birth Comments: WNL  2 IAB 02/22/11 [redacted]w[redacted]d       DEC     Birth Comments: no complications  1 Term [id number] [redacted]w[redacted]d  7 lb 14 oz (3.572 kg) M Vag-Spont EPI N LIV    Past Medical History:  Diagnosis Date   BV (bacterial vaginosis)    Heart burn    Infection    No pertinent past medical history     Past Surgical History:  Procedure Laterality Date   ESOPHAGEAL MANOMETRY N/A 06/28/2017   Procedure: ESOPHAGEAL MANOMETRY (EM);  Surgeon: Otis Brace, MD;  Location: [facility];  Service: Gastroenterology;   Laterality: N/A;     Current Outpatient Medications:    ibuprofen (ADVIL) 800 MG tablet, Take 1 tablet (800 mg total) by mouth every 8 (eight) hours as needed., Disp: 30 tablet, Rfl: 5   ALPRAZolam (XANAX) 0.25 MG tablet, Take 1 tablet (0.25 mg total) by mouth at bedtime as needed for anxiety. (Patient not taking: Reported on 08/16/2022), Disp: 30 tablet, Rfl: 0   benzonatate (TESSALON) 100 MG capsule, Take 1 capsule (100 mg total) by mouth every 8 (eight) hours. (Patient not taking: Reported on 08/16/2022), Disp: 21 capsule, Rfl: 0   escitalopram (LEXAPRO) 10 MG tablet, Take 1 tablet (10 mg total) by mouth daily. (Patient not taking: Reported on 08/16/2022), Disp: 30 tablet, Rfl: 5   esomeprazole (NEXIUM) 40 MG capsule, Take 1 capsule (40 mg total) by mouth daily. (Patient not taking: Reported on 08/16/2022), Disp: 30 capsule, Rfl: 0   fluticasone (FLONASE) 50 MCG/ACT nasal spray, Place 1 spray into both nostrils daily. (Patient not taking: Reported on 08/16/2022), Disp: 16 g, Rfl: 2   LO LOESTRIN FE 1 MG-10 MCG / 10 MCG tablet, Take 1 tablet by mouth daily. (Patient not taking: Reported on 08/16/2022), Disp: 3 Package, Rfl: 4   metroNIDAZOLE (FLAGYL) 500 MG tablet, Take 1 tablet (500 mg total) by mouth 2 (two) times daily. (Patient not taking: Reported on 08/16/2022), Disp:  14 tablet, Rfl: 0   phentermine 37.5 MG capsule, Take 1 capsule (37.5 mg total) by mouth every morning. (Patient not taking: Reported on 08/16/2022), Disp: 30 capsule, Rfl: 2 No Known Allergies  Social History   Tobacco Use   Smoking status: Never    Passive exposure: Never   Smokeless tobacco: Never  Substance Use Topics   Alcohol use: No    Family History  Problem Relation Age of Onset   Hypertension Mother    Diabetes Maternal Grandmother    Kidney disease Maternal Grandmother        on dialysis      Review of Systems  Constitutional: negative for fatigue and weight loss Respiratory: negative for cough and  wheezing Cardiovascular: negative for chest pain, fatigue and palpitations Gastrointestinal: negative for abdominal pain and change in bowel habits Musculoskeletal:negative for myalgias Neurological: negative for gait problems and tremors Behavioral/Psych: negative for abusive relationship, depression Endocrine: negative for temperature intolerance    Genitourinary:positive for vaginal discharge.  negative for abnormal menstrual periods, genital lesions, hot flashes, sexual problems  Integument/breast: negative for breast lump, breast tenderness, nipple discharge and skin lesion(s)    Objective:       BP 125/79   Pulse 66   Wt 194 lb 11.2 oz (88.3 kg)   LMP 08/05/2022   BMI 36.79 kg/m  General:   Alert and no distress  Skin:   no rash or abnormalities  Lungs:   clear to auscultation bilaterally  Heart:   regular rate and rhythm, S1, S2 normal, no murmur, click, rub or gallop  Breasts:   normal without suspicious masses, skin or nipple changes or axillary nodes  Abdomen:  normal findings: no organomegaly, soft, non-tender and no hernia  Pelvis:  External genitalia: normal general appearance Urinary system: urethral meatus normal and bladder without fullness, nontender Vaginal: normal without tenderness, induration or masses Cervix: normal appearance Adnexa: normal bimanual exam Uterus: anteverted and non-tender, normal size   Lab Review Urine pregnancy test Labs reviewed no Radiologic studies reviewed no  I have spent a total of 20 minutes of face-to-face and non-face-to-face time, excluding clinical staff time, reviewing notes and preparing to see patient, ordering tests and/or medications, and counseling the patient.   Assessment:    1. Encounter for routine gynecological examination with Papanicolaou smear of cervix Rx: - Cytology - PAP( [place])  2. Dysmenorrhea Rx: - ibuprofen (ADVIL) 800 MG tablet; Take 1 tablet (800 mg total) by mouth every 8 (eight) hours as  needed.  Dispense: 30 tablet; Refill: 5  3. Vaginal discharge X: - Cervicovaginal ancillary only( [place])  4. Screening for STD (sexually transmitted disease) x - Hepatitis B surface antigen - Hepatitis C antibody - RPR - HIV Antibody (routine testing w rflx)  5. Obesity due to excess calories without serious comorbidity, unspecified classification X: - phentermine 37.5 MG capsule; Take 1 capsule (37.5 mg total) by mouth every morning.  Dispense: 30 capsule; Refill: 2    Plan:    Education reviewed: calcium supplements, depression evaluation, low fat, low cholesterol diet, safe sex/STD prevention, self breast exams, and weight bearing exercise. Contraception: condoms. Follow up in: 1 year.       Shelly Bombard, MD 08/16/2022 9:45 AM

## 2022-08-16 NOTE — Progress Notes (Signed)
Pt presents for annual, pap, and all STD testing. Negative pap 05/28/21

## 2022-08-17 ENCOUNTER — Other Ambulatory Visit: Payer: Self-pay | Admitting: Obstetrics

## 2022-08-17 DIAGNOSIS — B379 Candidiasis, unspecified: Secondary | ICD-10-CM

## 2022-08-17 LAB — RPR: RPR Ser Ql: NONREACTIVE

## 2022-08-17 LAB — CERVICOVAGINAL ANCILLARY ONLY
Bacterial Vaginitis (gardnerella): POSITIVE — AB
Candida Glabrata: NEGATIVE
Candida Vaginitis: POSITIVE — AB
Chlamydia: NEGATIVE
Comment: NEGATIVE
Comment: NEGATIVE
Comment: NEGATIVE
Comment: NEGATIVE
Comment: NEGATIVE
Comment: NORMAL
Neisseria Gonorrhea: NEGATIVE
Trichomonas: NEGATIVE

## 2022-08-17 LAB — HIV ANTIBODY (ROUTINE TESTING W REFLEX): HIV Screen 4th Generation wRfx: NONREACTIVE

## 2022-08-17 LAB — CYTOLOGY - PAP
Adequacy: ABSENT
Comment: NEGATIVE
Diagnosis: NEGATIVE
High risk HPV: NEGATIVE

## 2022-08-17 LAB — HEPATITIS C ANTIBODY: Hep C Virus Ab: NONREACTIVE

## 2022-08-17 LAB — HEPATITIS B SURFACE ANTIGEN: Hepatitis B Surface Ag: NEGATIVE

## 2022-08-17 MED ORDER — FLUCONAZOLE 150 MG PO TABS: 150.0000 mg | ORAL_TABLET | Freq: Once | ORAL | 0 refills | Status: DC

## 2022-08-18 NOTE — Progress Notes (Signed)
Pt advised of BV and yeast infections and RXs sent. Pt verbalized understanding. All questions answered. MyChart message with education sent.

## 2022-08-22 ENCOUNTER — Other Ambulatory Visit: Payer: Self-pay | Admitting: *Deleted

## 2022-08-22 DIAGNOSIS — N760 Acute vaginitis: Secondary | ICD-10-CM

## 2022-08-22 MED ORDER — METRONIDAZOLE 500 MG PO TABS: 500.0000 mg | ORAL_TABLET | Freq: Two times a day (BID) | ORAL | 0 refills | Status: DC

## 2022-08-24 ENCOUNTER — Other Ambulatory Visit: Payer: Self-pay | Admitting: Obstetrics

## 2022-08-24 DIAGNOSIS — E6609 Other obesity due to excess calories: Secondary | ICD-10-CM

## 2022-08-24 MED ORDER — PHENTERMINE HCL 37.5 MG PO CAPS: 37.5000 mg | ORAL_CAPSULE | ORAL | 2 refills | Status: DC

## 2022-08-26 ENCOUNTER — Other Ambulatory Visit: Payer: Self-pay | Admitting: Obstetrics

## 2022-08-26 DIAGNOSIS — F4310 Post-traumatic stress disorder, unspecified: Secondary | ICD-10-CM

## 2022-11-15 ENCOUNTER — Other Ambulatory Visit: Payer: Self-pay | Admitting: Obstetrics

## 2022-11-15 DIAGNOSIS — B379 Candidiasis, unspecified: Secondary | ICD-10-CM

## 2022-12-15 DIAGNOSIS — J209 Acute bronchitis, unspecified: Secondary | ICD-10-CM | POA: Diagnosis not present

## 2022-12-15 DIAGNOSIS — J019 Acute sinusitis, unspecified: Secondary | ICD-10-CM | POA: Diagnosis not present

## 2023-05-22 ENCOUNTER — Ambulatory Visit (INDEPENDENT_AMBULATORY_CARE_PROVIDER_SITE_OTHER): Payer: BC Managed Care – PPO | Admitting: *Deleted

## 2023-05-22 VITALS — BP 134/83 | HR 91

## 2023-05-22 DIAGNOSIS — N912 Amenorrhea, unspecified: Secondary | ICD-10-CM | POA: Diagnosis not present

## 2023-05-22 DIAGNOSIS — Z3200 Encounter for pregnancy test, result unknown: Secondary | ICD-10-CM

## 2023-05-22 DIAGNOSIS — Z3201 Encounter for pregnancy test, result positive: Secondary | ICD-10-CM

## 2023-05-22 DIAGNOSIS — Z3480 Encounter for supervision of other normal pregnancy, unspecified trimester: Secondary | ICD-10-CM

## 2023-05-22 DIAGNOSIS — O219 Vomiting of pregnancy, unspecified: Secondary | ICD-10-CM

## 2023-05-22 LAB — POCT URINE PREGNANCY: Preg Test, Ur: POSITIVE — AB

## 2023-05-22 MED ORDER — PRENATAL VITAMIN/MIN +DHA 27-0.8-200 MG PO CAPS: 1.0000 | ORAL_CAPSULE | Freq: Every day | ORAL | 12 refills | Status: DC

## 2023-05-22 MED ORDER — PROMETHAZINE HCL 25 MG PO TABS: 25.0000 mg | ORAL_TABLET | Freq: Four times a day (QID) | ORAL | 1 refills | Status: DC | PRN

## 2023-05-22 NOTE — Progress Notes (Signed)
Ms. Jaudon presents today for UPT. She has no unusual complaints. LMP:    OBJECTIVE: Appears well, in no apparent distress.  OB History     Gravida  3   Para  2   Term  2   Preterm      AB  1   Living  2      SAB      IAB  1   Ectopic      Multiple  0   Live Births  2          Home UPT Result: positive In-Office UPT result: positive I have reviewed the patient's medical, obstetrical, social, and family histories, and medications.   ASSESSMENT: Positive pregnancy test  PLAN Prenatal care to be completed at: [facility]  US/ Intake scheduled at check out Ectopic, SAB and extreme N/V precautions given

## 2023-05-26 ENCOUNTER — Inpatient Hospital Stay (HOSPITAL_COMMUNITY)
Admission: AD | Admit: 2023-05-26 | Payer: BC Managed Care – PPO | Source: Home / Self Care | Admitting: Obstetrics and Gynecology

## 2023-05-26 ENCOUNTER — Inpatient Hospital Stay (HOSPITAL_COMMUNITY): Payer: BC Managed Care – PPO

## 2023-05-26 DIAGNOSIS — O26891 Other specified pregnancy related conditions, first trimester: Secondary | ICD-10-CM | POA: Insufficient documentation

## 2023-05-26 DIAGNOSIS — Z3A00 Weeks of gestation of pregnancy not specified: Secondary | ICD-10-CM | POA: Diagnosis not present

## 2023-05-26 DIAGNOSIS — D649 Anemia, unspecified: Secondary | ICD-10-CM | POA: Insufficient documentation

## 2023-05-26 DIAGNOSIS — O209 Hemorrhage in early pregnancy, unspecified: Secondary | ICD-10-CM | POA: Diagnosis not present

## 2023-05-26 DIAGNOSIS — Z3A01 Less than 8 weeks gestation of pregnancy: Secondary | ICD-10-CM | POA: Insufficient documentation

## 2023-05-26 DIAGNOSIS — Z3490 Encounter for supervision of normal pregnancy, unspecified, unspecified trimester: Secondary | ICD-10-CM

## 2023-05-26 DIAGNOSIS — O99011 Anemia complicating pregnancy, first trimester: Secondary | ICD-10-CM | POA: Diagnosis not present

## 2023-05-26 LAB — URINALYSIS, ROUTINE W REFLEX MICROSCOPIC
Bacteria, UA: NONE SEEN
Bilirubin Urine: NEGATIVE
Glucose, UA: NEGATIVE mg/dL
Ketones, ur: NEGATIVE mg/dL
Leukocytes,Ua: NEGATIVE
Nitrite: NEGATIVE
Protein, ur: NEGATIVE mg/dL
Specific Gravity, Urine: 1.016 (ref 1.005–1.030)
pH: 5 (ref 5.0–8.0)

## 2023-05-26 LAB — CBC
HCT: 31.3 % — ABNORMAL LOW (ref 36.0–46.0)
Hemoglobin: 9.1 g/dL — ABNORMAL LOW (ref 12.0–15.0)
MCH: 18.2 pg — ABNORMAL LOW (ref 26.0–34.0)
MCHC: 29.1 g/dL — ABNORMAL LOW (ref 30.0–36.0)
MCV: 62.5 fL — ABNORMAL LOW (ref 80.0–100.0)
Platelets: 563 10*3/uL — ABNORMAL HIGH (ref 150–400)
RBC: 5.01 MIL/uL (ref 3.87–5.11)
RDW: 19.9 % — ABNORMAL HIGH (ref 11.5–15.5)
WBC: 9.5 10*3/uL (ref 4.0–10.5)
nRBC: 0 % (ref 0.0–0.2)

## 2023-05-26 LAB — WET PREP, GENITAL
Clue Cells Wet Prep HPF POC: NONE SEEN
Sperm: NONE SEEN
Trich, Wet Prep: NONE SEEN
WBC, Wet Prep HPF POC: <10 (ref <10)
Yeast Wet Prep HPF POC: NONE SEEN

## 2023-05-26 NOTE — MAU Note (Addendum)
Pt says has had lower abd pressure all day 5/10. But had BM - blood in stool- so she thought it was from rectum Then at 8 pm- back to b-room- saw VB when she wiped - no pad on now. Pressure - now 3/10- no meds PNC- with [facility]

## 2023-05-27 MED ORDER — FERROUS SULFATE 325 (65 FE) MG PO TBEC: 325.0000 mg | DELAYED_RELEASE_TABLET | Freq: Every day | ORAL | 0 refills | Status: DC

## 2023-05-27 NOTE — MAU Provider Note (Signed)
History     CSN: [id number]  Arrival date and time: 05/26/23 2112   None     Chief Complaint  Patient presents with   Vaginal Bleeding   Mary Mora is a 32 y.o. [id number] at [redacted]w[redacted]d who presents with 1 day of vaginal bleeding. The amount was scant and only when she wiped. She denies vaginal odor, discharge, pelvic cramping, and dysuria or any other symptoms.   OB History     Gravida  4   Para  2   Term  2   Preterm      AB  1   Living  2      SAB      IAB  1   Ectopic      Multiple  0   Live Births  2           Past Medical History:  Diagnosis Date   BV (bacterial vaginosis)    Heart burn    Infection    No pertinent past medical history     Past Surgical History:  Procedure Laterality Date   ESOPHAGEAL MANOMETRY N/A 06/28/2017   Procedure: ESOPHAGEAL MANOMETRY (EM);  Surgeon: Kathi Der, MD;  Location: [facility];  Service: Gastroenterology;  Laterality: N/A;    Family History  Problem Relation Age of Onset   Hypertension Mother    Diabetes Maternal Grandmother    Kidney disease Maternal Grandmother        on dialysis    Social History   Tobacco Use   Smoking status: Never    Passive exposure: Never   Smokeless tobacco: Never  Substance Use Topics   Alcohol use: No   Drug use: No    Allergies: No Known Allergies  Medications Prior to Admission  Medication Sig Dispense Refill Last Dose   ALPRAZolam (XANAX) 0.25 MG tablet Take 1 tablet (0.25 mg total) by mouth at bedtime as needed for anxiety. 30 tablet 0    benzonatate (TESSALON) 100 MG capsule Take 1 capsule (100 mg total) by mouth every 8 (eight) hours. 21 capsule 0    escitalopram (LEXAPRO) 10 MG tablet Take 1 tablet (10 mg total) by mouth daily. 30 tablet 5    esomeprazole (NEXIUM) 40 MG capsule Take 1 capsule (40 mg total) by mouth daily. 30 capsule 0    fluticasone (FLONASE) 50 MCG/ACT nasal spray Place 1 spray into both nostrils daily. 16 g 2    ibuprofen (ADVIL)  800 MG tablet Take 1 tablet (800 mg total) by mouth every 8 (eight) hours as needed. 30 tablet 5    LO LOESTRIN FE 1 MG-10 MCG / 10 MCG tablet Take 1 tablet by mouth daily. 3 Package 4    metroNIDAZOLE (FLAGYL) 500 MG tablet Take 1 tablet (500 mg total) by mouth 2 (two) times daily. 14 tablet 0    phentermine 37.5 MG capsule Take 1 capsule (37.5 mg total) by mouth every morning. 30 capsule 2    Prenatal Vit-Fe Sulfate-FA-DHA (PRENATAL VITAMIN/MIN +DHA) 27-0.8-200 MG CAPS Take 1 tablet by mouth daily. 30 capsule 12    promethazine (PHENERGAN) 25 MG tablet Take 1 tablet (25 mg total) by mouth every 6 (six) hours as needed for nausea or vomiting. 30 tablet 1     Review of Systems  Constitutional:  Negative for fever.  Cardiovascular:  Negative for chest pain and palpitations.  Gastrointestinal:  Negative for abdominal pain, nausea and vomiting.  Genitourinary:  Positive for vaginal bleeding. Negative for  dysuria and pelvic pain.  Neurological:  Negative for light-headedness and headaches.   Physical Exam   Blood pressure 122/62, pulse 100, temperature 98.2 F (36.8 C), temperature source Oral, resp. rate 12, height 5\' 2"  (1.575 m), weight 89.1 kg, last menstrual period 04/18/2023.  Physical Exam Vitals and nursing note reviewed.  Constitutional:      General: She is not in acute distress.    Appearance: She is not ill-appearing, toxic-appearing or diaphoretic.  HENT:     Head: Normocephalic.     Nose: Nose normal.  Eyes:     Extraocular Movements: Extraocular movements intact.     Conjunctiva/sclera: Conjunctivae normal.  Cardiovascular:     Rate and Rhythm: Normal rate.  Pulmonary:     Effort: Pulmonary effort is normal.  Neurological:     Mental Status: She is alert and oriented to person, place, and time.  Psychiatric:        Mood and Affect: Mood normal.        Behavior: Behavior normal.     MAU Course  Procedures  MDM Results for orders placed or performed during the  hospital encounter of 05/26/23 (from the past 24 hour(s))  Wet prep, genital     Status: None   Collection Time: 05/26/23 10:04 PM   Specimen: PATH Cytology Cervicovaginal Ancillary Only  Result Value Ref Range   Yeast Wet Prep HPF POC NONE SEEN NONE SEEN   Trich, Wet Prep NONE SEEN NONE SEEN   Clue Cells Wet Prep HPF POC NONE SEEN NONE SEEN   WBC, Wet Prep HPF POC <10 <10   Sperm NONE SEEN   Urinalysis, Routine w reflex microscopic -Urine, Clean Catch     Status: Abnormal   Collection Time: 05/26/23 10:05 PM  Result Value Ref Range   Color, Urine YELLOW YELLOW   APPearance CLEAR CLEAR   Specific Gravity, Urine 1.016 1.005 - 1.030   pH 5.0 5.0 - 8.0   Glucose, UA NEGATIVE NEGATIVE mg/dL   Hgb urine dipstick SMALL (A) NEGATIVE   Bilirubin Urine NEGATIVE NEGATIVE   Ketones, ur NEGATIVE NEGATIVE mg/dL   Protein, ur NEGATIVE NEGATIVE mg/dL   Nitrite NEGATIVE NEGATIVE   Leukocytes,Ua NEGATIVE NEGATIVE   RBC / HPF 0-5 0 - 5 RBC/hpf   WBC, UA 0-5 0 - 5 WBC/hpf   Bacteria, UA NONE SEEN NONE SEEN   Squamous Epithelial / HPF 0-5 0 - 5 /HPF  CBC     Status: Abnormal   Collection Time: 05/26/23 10:50 PM  Result Value Ref Range   WBC 9.5 4.0 - 10.5 K/uL   RBC 5.01 3.87 - 5.11 MIL/uL   Hemoglobin 9.1 (L) 12.0 - 15.0 g/dL   HCT 09.8 (L) 11.9 - 14.7 %   MCV 62.5 (L) 80.0 - 100.0 fL   MCH 18.2 (L) 26.0 - 34.0 pg   MCHC 29.1 (L) 30.0 - 36.0 g/dL   RDW 82.9 (H) 56.2 - 13.0 %   Platelets 563 (H) 150 - 400 K/uL   nRBC 0.0 0.0 - 0.2 %  Comprehensive metabolic panel     Status: Abnormal   Collection Time: 05/26/23 10:50 PM  Result Value Ref Range   Sodium 137 135 - 145 mmol/L   Potassium 4.1 3.5 - 5.1 mmol/L   Chloride 103 98 - 111 mmol/L   CO2 21 (L) 22 - 32 mmol/L   Glucose, Bld 98 70 - 99 mg/dL   BUN 7 6 - 20 mg/dL   Creatinine,  Ser 0.67 0.44 - 1.00 mg/dL   Calcium 9.6 8.9 - 40.9 mg/dL   Total Protein 7.5 6.5 - 8.1 g/dL   Albumin 3.9 3.5 - 5.0 g/dL   AST 18 15 - 41 U/L   ALT 16 0 -  44 U/L   Alkaline Phosphatase 54 38 - 126 U/L   Total Bilirubin 0.6 0.3 - 1.2 mg/dL   GFR, Estimated >81 >19 mL/min   Anion gap 13 5 - 15  hCG, quantitative, pregnancy     Status: Abnormal   Collection Time: 05/26/23 10:50 PM  Result Value Ref Range   hCG, Beta Chain, Quant, S 33,910 (H) <5 mIU/mL   OBSTETRIC <14 WK [place] AND TRANSVAGINAL OB US   TECHNIQUE: Both transabdominal and transvaginal ultrasound examinations were performed for complete evaluation of the gestation as well as the maternal uterus, adnexal regions, and pelvic cul-de-sac. Transvaginal technique was performed to assess early pregnancy.   COMPARISON:  None available this pregnancy.   FINDINGS: Intrauterine gestational sac: Single   Yolk sac:  Visualized.   Embryo:  Not Visualized.   Cardiac Activity: Not Visualized.   MSD: 13.4 mm   6 w   1 d   Subchorionic hemorrhage:  None visualized.   Maternal uterus/adnexae: The right ovary is normal in demonstrates blood flow. 2.1 cm follicle in the left ovary, normal ovarian blood flow is seen. No adnexal mass. No pelvic free fluid.   IMPRESSION: 1. Intrauterine gestational sac contains a yolk sac but no fetal pole. Findings likely represent early intrauterine pregnancy. Recommend trending of beta HCG and follow-up ultrasound in 7-10 days. 2. No subchorionic hemorrhage.     Electronically Signed   By: Narda Rutherford M.D.   On: 05/26/2023 23:25  Assessment and Plan  1. Intrauterine pregnancy Needs follow up US in 7-10 days. Return precautions given. Message sent to [facility] office.   2. Anemia affecting pregnancy in first trimester Asymptomatic. Continue PNV. Start iron supplement.   3. [redacted] weeks gestation of pregnancy    Trameka Dorough Autry-Lott 05/27/2023, 12:28 AM

## 2023-05-29 ENCOUNTER — Other Ambulatory Visit: Payer: Self-pay

## 2023-05-29 DIAGNOSIS — O3680X0 Pregnancy with inconclusive fetal viability, not applicable or unspecified: Secondary | ICD-10-CM

## 2023-06-12 ENCOUNTER — Ambulatory Visit (HOSPITAL_BASED_OUTPATIENT_CLINIC_OR_DEPARTMENT_OTHER)
Admission: RE | Admit: 2023-06-12 | Discharge: 2023-06-12 | Disposition: A | Discharge status: Home / Self Care | Payer: BC Managed Care – PPO | Source: Ambulatory Visit | Attending: Obstetrics and Gynecology | Admitting: Obstetrics and Gynecology

## 2023-06-12 DIAGNOSIS — O3680X0 Pregnancy with inconclusive fetal viability, not applicable or unspecified: Secondary | ICD-10-CM | POA: Insufficient documentation

## 2023-06-12 DIAGNOSIS — O208 Other hemorrhage in early pregnancy: Secondary | ICD-10-CM | POA: Diagnosis not present

## 2023-06-12 DIAGNOSIS — N83202 Unspecified ovarian cyst, left side: Secondary | ICD-10-CM | POA: Diagnosis not present

## 2023-06-12 DIAGNOSIS — Z3A08 8 weeks gestation of pregnancy: Secondary | ICD-10-CM | POA: Diagnosis not present

## 2023-06-12 DIAGNOSIS — O3481 Maternal care for other abnormalities of pelvic organs, first trimester: Secondary | ICD-10-CM | POA: Diagnosis not present

## 2023-06-13 ENCOUNTER — Ambulatory Visit: Payer: BC Managed Care – PPO | Admitting: Emergency Medicine

## 2023-06-13 DIAGNOSIS — O3680X0 Pregnancy with inconclusive fetal viability, not applicable or unspecified: Secondary | ICD-10-CM

## 2023-06-13 NOTE — Progress Notes (Signed)
TC to patient to discuss [place] results, inform of upcoming appointments.   Advised pelvic rest for subchorionic hemorrhage.

## 2023-06-22 ENCOUNTER — Ambulatory Visit: Payer: BC Managed Care – PPO | Admitting: *Deleted

## 2023-06-22 VITALS — BP 130/83 | HR 92 | Wt 197.5 lb

## 2023-06-22 DIAGNOSIS — Z3A09 9 weeks gestation of pregnancy: Secondary | ICD-10-CM

## 2023-06-22 DIAGNOSIS — Z3480 Encounter for supervision of other normal pregnancy, unspecified trimester: Secondary | ICD-10-CM | POA: Diagnosis not present

## 2023-06-22 DIAGNOSIS — Z1339 Encounter for screening examination for other mental health and behavioral disorders: Secondary | ICD-10-CM | POA: Diagnosis not present

## 2023-06-22 DIAGNOSIS — Z3481 Encounter for supervision of other normal pregnancy, first trimester: Secondary | ICD-10-CM

## 2023-06-22 MED ORDER — DOXYLAMINE-PYRIDOXINE 10-10 MG PO TBEC
2.0000 | DELAYED_RELEASE_TABLET | Freq: Every day | ORAL | 5 refills | Status: DC
Start: 2023-06-22 — End: 2023-08-29

## 2023-06-22 NOTE — Progress Notes (Deleted)
New OB Intake  I connected with Mary Mora  on 06/22/23 at  3:10 PM EDT by {Contact:24193} Video Visit and verified that I am speaking with the correct person using two identifiers. Nurse is located at [facility] and pt is located at ***.  I discussed the limitations, risks, security and privacy concerns of performing an evaluation and management service by telephone and the availability of in person appointments. I also discussed with the patient that there may be a patient responsible charge related to this service. The patient expressed understanding and agreed to proceed.  I explained I am completing New OB Intake today. We discussed EDD of 01/23/2024, by Last Menstrual Period. Pt is [id number]. I reviewed her allergies, medications and Medical/Surgical/OB history.    Patient Active Problem List   Diagnosis Date Noted   Achalasia of cardia 09/01/2017   Indication for care in labor or delivery 12/03/2014   NSVD (normal spontaneous vaginal delivery) 12/03/2014   Iron deficiency anemia 06/15/2014   Supervision of other normal pregnancy 06/11/2014    Concerns addressed today  Delivery Plans Plans to deliver at [facility]. Discussed the nature of our practice with multiple providers including residents and students. Due to the size of the practice, the delivering provider may not be the same as those providing prenatal care.   Patient {Is/is not:9024} interested in water birth. Offered upcoming OB visit with CNM to discuss further.  MyChart/Babyscripts MyChart access verified. I explained pt will have some visits in office and some virtually. Babyscripts instructions given and order placed. Patient verifies receipt of registration text/e-mail. Account successfully created and app downloaded.  Blood Pressure Cuff/Weight Scale {blood pressure cuff:24241} Explained after first prenatal appt pt will check weekly and document in Babyscripts. Patient {weight scale:28336}.  Anatomy US Explained  first scheduled [place] will be around 19 weeks. Anatomy US scheduled for *** at ***.  Is patient a CenteringPregnancy candidate?  {Accepted:19197::"Accepted","Not a Candidate","Declined"} Declined due to {Declined:19197::"Schedule","Childcare","Group setting","Support person concern","Declined to say","Enrolled in [facility]","***"} Not a candidate due to {Not a Candidate:19197::"DM","CHTN, medication controlled","Language barrier",">28 weeks","Multiple gestation (mono-mono or mono-di)","Complex coordination of care needed","***"} If accepted,    Is patient a Mom+Baby Combined Care candidate?  {Accepted:19197::"Accepted","Declined","Not a candidate","***"}   If accepted, confirm patient does not intend to move from the area for at least 12 months, then notify Mom+Baby staff  Interested in [place]? If yes, send referral and doula dot phrase.   Is patient a candidate for Babyscripts Optimization? {Yes or If no, why not?:20788}  First visit review I reviewed new OB appt with patient. Explained pt will be seen by *** at first visit. Discussed Avelina Laine genetic screening with patient. *** Panorama and Horizon.. Routine prenatal labs {collected today/needed at new OB visit:9024}   Last Pap Diagnosis  Date Value Ref Range Status  08/16/2022   Final   - Negative for intraepithelial lesion or malignancy (NILM)    Harrel Lemon, RN 06/22/2023  3:09 PM

## 2023-06-22 NOTE — Patient Instructions (Signed)

## 2023-06-22 NOTE — Progress Notes (Signed)
New OB Intake  I connected with Bascom Levels  on 06/22/23 at  3:10 PM EDT by In Person Visit and verified that I am speaking with the correct person using two identifiers. Nurse is located at [facility] and pt is located at [facility].  I discussed the limitations, risks, security and privacy concerns of performing an evaluation and management service by telephone and the availability of in person appointments. I also discussed with the patient that there may be a patient responsible charge related to this service. The patient expressed understanding and agreed to proceed.  I explained I am completing New OB Intake today. We discussed EDD of 01/23/2024, by Last Menstrual Period. Pt is [id number]. I reviewed her allergies, medications and Medical/Surgical/OB history.    Patient Active Problem List   Diagnosis Date Noted   Supervision of other normal pregnancy, antepartum 06/22/2023   Achalasia of cardia 09/01/2017   Indication for care in labor or delivery 12/03/2014   NSVD (normal spontaneous vaginal delivery) 12/03/2014   Iron deficiency anemia 06/15/2014   Supervision of other normal pregnancy 06/11/2014    Concerns addressed today  Delivery Plans Plans to deliver at [facility]. Discussed the nature of our practice with multiple providers including residents and students. Due to the size of the practice, the delivering provider may not be the same as those providing prenatal care.   Patient is interested in water birth. Offered upcoming OB visit with CNM to discuss further.  MyChart/Babyscripts MyChart access verified. I explained pt will have some visits in office and some virtually. Babyscripts instructions given and order placed. Patient verifies receipt of registration text/e-mail. Account successfully created and app downloaded.  Blood Pressure Cuff/Weight Scale Patient has private insurance; instructed to purchase blood pressure cuff and bring to first prenatal appt. Explained after  first prenatal appt pt will check weekly and document in Babyscripts. Patient does not have weight scale; patient may purchase if they desire to track weight weekly in Babyscripts.  Anatomy US Explained first scheduled [place] will be around 19 weeks. Anatomy US scheduled for TBD at TBD.  Interested in [place]? If yes, send referral and doula dot phrase.   Is patient a candidate for Babyscripts Optimization? Yes  First visit review I reviewed new OB appt with patient. Explained pt will be seen by Sharen Counter, CNM at first visit. Discussed Avelina Laine genetic screening with patient. Requests Panorama and Horizon.. Routine prenatal labs  OB Urine collected at today's visit. Pt opted for OB Panel and Genetic Screening labs to be collected at [facility]. Pap UTD. GC/CC done in MAU on 05/26/23.    Last Pap Diagnosis  Date Value Ref Range Status  08/16/2022   Final   - Negative for intraepithelial lesion or malignancy (NILM)    Harrel Lemon, RN 06/22/2023  3:38 PM

## 2023-06-22 NOTE — Progress Notes (Signed)
Informal BS US shows IUP at expected GA of [redacted]w[redacted]d. Cardiac activity and fetal movement observed.

## 2023-06-24 LAB — URINE CULTURE, OB REFLEX: Organism ID, Bacteria: NO GROWTH

## 2023-06-24 LAB — CULTURE, OB URINE

## 2023-07-04 ENCOUNTER — Ambulatory Visit (INDEPENDENT_AMBULATORY_CARE_PROVIDER_SITE_OTHER): Payer: BC Managed Care – PPO | Admitting: Advanced Practice Midwife

## 2023-07-04 ENCOUNTER — Encounter: Payer: Self-pay | Admitting: Advanced Practice Midwife

## 2023-07-04 ENCOUNTER — Ambulatory Visit (INDEPENDENT_AMBULATORY_CARE_PROVIDER_SITE_OTHER): Payer: BC Managed Care – PPO | Admitting: Licensed Clinical Social Worker

## 2023-07-04 VITALS — BP 139/76 | HR 90 | Wt 199.0 lb

## 2023-07-04 DIAGNOSIS — Z3481 Encounter for supervision of other normal pregnancy, first trimester: Secondary | ICD-10-CM

## 2023-07-04 DIAGNOSIS — Z31430 Encounter of female for testing for genetic disease carrier status for procreative management: Secondary | ICD-10-CM | POA: Diagnosis not present

## 2023-07-04 DIAGNOSIS — Z1339 Encounter for screening examination for other mental health and behavioral disorders: Secondary | ICD-10-CM

## 2023-07-04 DIAGNOSIS — Z3480 Encounter for supervision of other normal pregnancy, unspecified trimester: Secondary | ICD-10-CM | POA: Diagnosis not present

## 2023-07-04 DIAGNOSIS — Z3A11 11 weeks gestation of pregnancy: Secondary | ICD-10-CM

## 2023-07-04 NOTE — Progress Notes (Signed)
Pt presents for NOB visit. Pt c/o sharp pain on the left side.

## 2023-07-04 NOTE — Progress Notes (Signed)
   PRENATAL VISIT NOTE  Subjective:  ARIANA CALZADO is a 32 y.o. [phone] at [redacted]w[redacted]d being seen today for ongoing prenatal care.  She is currently monitored for the following issues for this low-risk pregnancy and has Supervision of other normal pregnancy; Iron deficiency anemia; Indication for care in labor or delivery; NSVD (normal spontaneous vaginal delivery); Achalasia of cardia; and Supervision of other normal pregnancy, antepartum on their problem list.  Patient reports no complaints.  Contractions: Not present. Vag. Bleeding: None.  Movement: Present. Denies leaking of fluid.   The following portions of the patient's history were reviewed and updated as appropriate: allergies, current medications, past family history, past medical history, past social history, past surgical history and problem list.   Objective:   Vitals:   07/04/23 1520  BP: 139/76  Pulse: 90  Weight: 199 lb (90.3 kg)    Fetal Status: Fetal Heart Rate (bpm): 172   Movement: Present     General:  Alert, oriented and cooperative. Patient is in no acute distress.  Skin: Skin is warm and dry. No rash noted.   Cardiovascular: Normal heart rate noted  Respiratory: Normal respiratory effort, no problems with respiration noted  Abdomen: Soft, gravid, appropriate for gestational age.  Pain/Pressure: Present     Pelvic: Cervical exam deferred        Extremities: Normal range of motion.  Edema: None  Mental Status: Normal mood and affect. Normal behavior. Normal judgment and thought content.   Assessment and Plan:  Pregnancy: [id number] at [redacted]w[redacted]d 1. Supervision of other normal pregnancy, antepartum --Anticipatory guidance about next visits/weeks of pregnancy given.  - Pt is interested in waterbirth.  No contraindications at this time per chart review/patient assessment.   - Pt to enroll in class, see CNMs for most visits in the office.  - Discussed waterbirth as option for low-risk pregnancy.  Reviewed conditions that  may arise during pregnancy that will risk pt out of waterbirth including hypertension, diabetes, fetal growth restriction <10%ile, etc.   - CBC/D/Plt+RPR+Rh+ABO+RubIgG... - HgB A1c - Comp Met (CMET) - [place] MFM OB COMP + 14 WK; Future  2. Encounter of female for testing for genetic disease carrier status for procreative management  - HORIZON Basic Panel  3. Encounter for supervision of other normal pregnancy in first trimester  - PANORAMA PRENATAL TEST  4. [redacted] weeks gestation of pregnancy   Preterm labor symptoms and general obstetric precautions including but not limited to vaginal bleeding, contractions, leaking of fluid and fetal movement were reviewed in detail with the patient. Please refer to After Visit Summary for other counseling recommendations.   Return in about 4 weeks (around 08/01/2023) for Midwife preferred.  No future appointments.  Sharen Counter, CNM

## 2023-07-05 LAB — COMPREHENSIVE METABOLIC PANEL
ALT: 10 IU/L (ref 0–32)
AST: 15 IU/L (ref 0–40)
Albumin: 4.3 g/dL (ref 3.9–4.9)
Alkaline Phosphatase: 63 IU/L (ref 44–121)
BUN/Creatinine Ratio: 11 (ref 9–23)
BUN: 6 mg/dL (ref 6–20)
Bilirubin Total: 0.2 mg/dL (ref 0.0–1.2)
CO2: 19 mmol/L — ABNORMAL LOW (ref 20–29)
Calcium: 9.6 mg/dL (ref 8.7–10.2)
Chloride: 104 mmol/L (ref 96–106)
Creatinine, Ser: 0.55 mg/dL — ABNORMAL LOW (ref 0.57–1.00)
Globulin, Total: 3 g/dL (ref 1.5–4.5)
Glucose: 92 mg/dL (ref 70–99)
Potassium: 4.7 mmol/L (ref 3.5–5.2)
Sodium: 138 mmol/L (ref 134–144)
Total Protein: 7.3 g/dL (ref 6.0–8.5)
eGFR: 124 mL/min/{1.73_m2} (ref >59)

## 2023-07-05 LAB — CBC/D/PLT+RPR+RH+ABO+RUBIGG...
Antibody Screen: NEGATIVE
Basophils Absolute: 0 10*3/uL (ref 0.0–0.2)
Basos: 0 %
EOS (ABSOLUTE): 0.1 10*3/uL (ref 0.0–0.4)
Eos: 1 %
HCV Ab: NONREACTIVE
HIV Screen 4th Generation wRfx: NONREACTIVE
Hematocrit: 31.4 % — ABNORMAL LOW (ref 34.0–46.6)
Hemoglobin: 9.3 g/dL — ABNORMAL LOW (ref 11.1–15.9)
Hepatitis B Surface Ag: NEGATIVE
Immature Grans (Abs): 0 10*3/uL (ref 0.0–0.1)
Immature Granulocytes: 0 %
Lymphocytes Absolute: 2 10*3/uL (ref 0.7–3.1)
Lymphs: 21 %
MCH: 19.3 pg — ABNORMAL LOW (ref 26.6–33.0)
MCHC: 29.6 g/dL — ABNORMAL LOW (ref 31.5–35.7)
MCV: 65 fL — ABNORMAL LOW (ref 79–97)
Monocytes Absolute: 0.5 10*3/uL (ref 0.1–0.9)
Monocytes: 5 %
Neutrophils Absolute: 6.5 10*3/uL (ref 1.4–7.0)
Neutrophils: 73 %
Platelets: 413 10*3/uL (ref 150–450)
RBC: 4.81 x10E6/uL (ref 3.77–5.28)
RDW: 19.9 % — ABNORMAL HIGH (ref 11.7–15.4)
RPR Ser Ql: NONREACTIVE
Rh Factor: POSITIVE
Rubella Antibodies, IGG: 1.47 {index} (ref >0.99)
WBC: 9.1 10*3/uL (ref 3.4–10.8)

## 2023-07-05 LAB — HEMOGLOBIN A1C
Est. average glucose Bld gHb Est-mCnc: 111 mg/dL
Hgb A1c MFr Bld: 5.5 % (ref 4.8–5.6)

## 2023-07-05 LAB — HCV INTERPRETATION

## 2023-07-12 NOTE — BH Specialist Note (Signed)
Integrated Behavioral Health Initial In-Person Visit  MRN: [id number] Name: Mary Mora  Number of Integrated Behavioral Health Clinician visits: 1 Session Start time:   3:30pm Session End time: 3:45pm Total time in minutes: 15 mins in person at [facility]   Types of Service: General Behavioral Integrated Care (BHI)  Interpretor:No. Interpretor Name and Language: none   Warm Hand Off Completed.        Subjective: Mary Mora is a 32 y.o. female accompanied by n/a Patient was referred by L Courtney Paris for new ob introduction. Patient reports the following symptoms/concerns: no reported concerns.  Duration of problem:n/a; Severity of problem: mild  Objective: Mood: good and Affect: Appropriate Risk of harm to self or others: No plan to harm self or others  Life Context: Family and Social: lives with family  School/Work: employed  Self-Care: n/a Life Changes: new pregnancy  Patient and/or Family's Strengths/Protective Factors: Concrete supports in place (healthy food, safe environments, etc.)  Goals Addressed: Patient will: Take prenatal vitamins  Prioritze rest   Keep prenatal appts Demonstrate ability to: Increase adequate support systems for patient/family  Progress towards Goals: Ongoing  Interventions: Interventions utilized: Supportive Counseling  Standardized Assessments completed: Not Needed  Patient and/or Family Response: [name] reports no concerns     Assessment: Patient completed new ob introduction.   Patient may benefit from integrated behavioral health.  Plan: Follow up with behavioral health clinician on : as needed  Behavioral recommendations: n/a Referral(s): Integrated [employer] (In Clinic) "From scale of 1-10, how likely are you to follow plan?":    Gwyndolyn Saxon, LCSW

## 2023-07-16 ENCOUNTER — Inpatient Hospital Stay (HOSPITAL_COMMUNITY)
Admission: AD | Admit: 2023-07-16 | Discharge: 2023-07-16 | Disposition: A | Discharge status: Home / Self Care | Payer: BC Managed Care – PPO | Attending: Family Medicine | Admitting: Family Medicine

## 2023-07-16 ENCOUNTER — Inpatient Hospital Stay (HOSPITAL_COMMUNITY): Payer: BC Managed Care – PPO

## 2023-07-16 ENCOUNTER — Encounter (HOSPITAL_COMMUNITY): Payer: Self-pay | Admitting: Obstetrics and Gynecology

## 2023-07-16 DIAGNOSIS — D649 Anemia, unspecified: Secondary | ICD-10-CM

## 2023-07-16 DIAGNOSIS — O209 Hemorrhage in early pregnancy, unspecified: Secondary | ICD-10-CM | POA: Insufficient documentation

## 2023-07-16 DIAGNOSIS — Z3A13 13 weeks gestation of pregnancy: Secondary | ICD-10-CM | POA: Diagnosis not present

## 2023-07-16 DIAGNOSIS — O4402 Placenta previa specified as without hemorrhage, second trimester: Secondary | ICD-10-CM | POA: Diagnosis not present

## 2023-07-16 DIAGNOSIS — O99011 Anemia complicating pregnancy, first trimester: Secondary | ICD-10-CM | POA: Diagnosis not present

## 2023-07-16 DIAGNOSIS — Z3A12 12 weeks gestation of pregnancy: Secondary | ICD-10-CM | POA: Diagnosis not present

## 2023-07-16 DIAGNOSIS — N939 Abnormal uterine and vaginal bleeding, unspecified: Secondary | ICD-10-CM | POA: Diagnosis not present

## 2023-07-16 DIAGNOSIS — O4411 Placenta previa with hemorrhage, first trimester: Secondary | ICD-10-CM | POA: Diagnosis not present

## 2023-07-16 DIAGNOSIS — O3441 Maternal care for other abnormalities of cervix, first trimester: Secondary | ICD-10-CM | POA: Diagnosis not present

## 2023-07-16 LAB — URINALYSIS, ROUTINE W REFLEX MICROSCOPIC
Bilirubin Urine: NEGATIVE
Glucose, UA: NEGATIVE mg/dL
Ketones, ur: NEGATIVE mg/dL
Leukocytes,Ua: NEGATIVE
Nitrite: NEGATIVE
Protein, ur: NEGATIVE mg/dL
Specific Gravity, Urine: 1.03 (ref 1.005–1.030)
pH: 5 (ref 5.0–8.0)

## 2023-07-16 LAB — CBC
HCT: 29.8 % — ABNORMAL LOW (ref 36.0–46.0)
Hemoglobin: 9 g/dL — ABNORMAL LOW (ref 12.0–15.0)
MCH: 19.9 pg — ABNORMAL LOW (ref 26.0–34.0)
MCHC: 30.2 g/dL (ref 30.0–36.0)
MCV: 65.9 fL — ABNORMAL LOW (ref 80.0–100.0)
Platelets: 401 10*3/uL — ABNORMAL HIGH (ref 150–400)
RBC: 4.52 MIL/uL (ref 3.87–5.11)
RDW: 20.1 % — ABNORMAL HIGH (ref 11.5–15.5)
WBC: 7.9 10*3/uL (ref 4.0–10.5)
nRBC: 0 % (ref 0.0–0.2)

## 2023-07-16 LAB — HCG, QUANTITATIVE, PREGNANCY: hCG, Beta Chain, Quant, S: 120037 m[IU]/mL — ABNORMAL HIGH (ref <5)

## 2023-07-16 MED ORDER — FERROUS SULFATE 325 (65 FE) MG PO TABS: 325.0000 mg | ORAL_TABLET | Freq: Every day | ORAL | 0 refills | Status: DC

## 2023-07-16 NOTE — MAU Note (Addendum)
..  Mary Mora is a 32 y.o. at [redacted]w[redacted]d here in MAU reporting: vaginal bleeding on her underwear and had a small clot, put on a pad and still feels like it continues to flow, has not been back to the restroom since she put the pad on. Denies abdominal pain.  Last intercourse: August Pain score: 0/10 Vitals:   07/16/23 1934  BP: 132/76  Pulse: 90  Resp: 17  Temp: 98.6 F (37 C)  SpO2: 100%     FHT:161 Lab orders placed from triage: UA

## 2023-07-16 NOTE — MAU Provider Note (Addendum)
History     CSN: [id number]  Arrival date and time: 07/16/23 1918   Event Date/Time   First Provider Initiated Contact with Patient 07/16/23 1954      Chief Complaint  Patient presents with   Vaginal Bleeding   HPI Mary Mora is a 32 y.o. [id number] at [redacted]w[redacted]d who presents with complaint of vaginal bleeding. Reports vaginal bleeding earlier today around 1800 -- she reports it was fresh blood in the toilet bowl and did see a single clot. She put on a pad, but has not had any more bleeding since then. She denies abdominal pain, fevers, chills, n/v, urinary symptoms.  Past Medical History:  Diagnosis Date   BV (bacterial vaginosis)    Heart burn    Infection    No pertinent past medical history     Past Surgical History:  Procedure Laterality Date   ESOPHAGEAL MANOMETRY N/A 06/28/2017   Procedure: ESOPHAGEAL MANOMETRY (EM);  Surgeon: Kathi Der, MD;  Location: [facility];  Service: Gastroenterology;  Laterality: N/A;    Family History  Problem Relation Age of Onset   Hypertension Mother    Diabetes Father    Hypertension Father    Diabetes Maternal Grandmother    Kidney disease Maternal Grandmother        on dialysis   Hypertension Paternal Grandmother    Aneurysm Paternal Grandmother     Social History   Tobacco Use   Smoking status: Never    Passive exposure: Never   Smokeless tobacco: Never  Substance Use Topics   Alcohol use: No   Drug use: No    Allergies: No Known Allergies  Medications Prior to Admission  Medication Sig Dispense Refill Last Dose   Prenatal Vit-Fe Fumarate-FA (PRENATAL VITAMIN PO) Take 1 tablet by mouth daily.   07/16/2023   Doxylamine-Pyridoxine (DICLEGIS) 10-10 MG TBEC Take 2 tablets by mouth at bedtime. If symptoms persist, add one tablet in the morning and one in the afternoon (Patient not taking: Reported on 07/04/2023) 100 tablet 5    escitalopram (LEXAPRO) 10 MG tablet Take 1 tablet (10 mg total) by mouth daily. (Patient  not taking: Reported on 07/04/2023) 30 tablet 5    ferrous sulfate 325 (65 FE) MG EC tablet Take 1 tablet (325 mg total) by mouth daily with breakfast. 30 tablet 0    fluticasone (FLONASE) 50 MCG/ACT nasal spray Place 1 spray into both nostrils daily. (Patient not taking: Reported on 06/22/2023) 16 g 2    Prenatal Vit-Fe Sulfate-FA-DHA (PRENATAL VITAMIN/MIN +DHA) 27-0.8-200 MG CAPS Take 1 tablet by mouth daily. (Patient not taking: Reported on 06/22/2023) 30 capsule 12    promethazine (PHENERGAN) 25 MG tablet Take 1 tablet (25 mg total) by mouth every 6 (six) hours as needed for nausea or vomiting. (Patient not taking: Reported on 06/22/2023) 30 tablet 1    ROS reviewed and pertinent positives and negatives as documented in HPI.  Physical Exam   Blood pressure 132/76, pulse 90, temperature 98.6 F (37 C), temperature source Oral, resp. rate 17, height 5\' 2"  (1.575 m), weight 89.6 kg, last menstrual period 04/18/2023, SpO2 100%.  Physical Exam Constitutional:      General: She is not in acute distress.    Appearance: Normal appearance.  HENT:     Head: Normocephalic and atraumatic.  Cardiovascular:     Rate and Rhythm: Normal rate and regular rhythm.     Heart sounds: Normal heart sounds.  Pulmonary:     Effort: Pulmonary effort  is normal. No respiratory distress.     Breath sounds: Normal breath sounds.  Abdominal:     General: There is no distension.     Palpations: Abdomen is soft.     Tenderness: There is no abdominal tenderness.  Musculoskeletal:        General: Normal range of motion.  Skin:    General: Skin is warm and dry.     Findings: No rash.  Neurological:     General: No focal deficit present.     Mental Status: She is alert and oriented to person, place, and time.     MAU Course  Procedures  MDM 32 y.o. [id number] at [redacted]w[redacted]d who presents with painless vaginal bleeding. VSS, exam benign. Has known IUP and previous hx subchorionic hemorrhage. Will proceed with labs,  swabs, and U/S to further assess.  9:29 PM  Patient signed out to Mary Mora, CNM.  Sundra Aland, MD OB Fellow, Faculty Practice [facility], [facility] for [facility]   Results for orders placed or performed during the hospital encounter of 07/16/23 (from the past 24 hour(s))  CBC     Status: Abnormal   Collection Time: 07/16/23  8:15 PM  Result Value Ref Range   WBC 7.9 4.0 - 10.5 K/uL   RBC 4.52 3.87 - 5.11 MIL/uL   Hemoglobin 9.0 (L) 12.0 - 15.0 g/dL   HCT 57.8 (L) 46.9 - 62.9 %   MCV 65.9 (L) 80.0 - 100.0 fL   MCH 19.9 (L) 26.0 - 34.0 pg   MCHC 30.2 30.0 - 36.0 g/dL   RDW 52.8 (H) 41.3 - 24.4 %   Platelets 401 (H) 150 - 400 K/uL   nRBC 0.0 0.0 - 0.2 %  hCG, quantitative, pregnancy     Status: Abnormal   Collection Time: 07/16/23  8:15 PM  Result Value Ref Range   hCG, Beta Chain, Quant, S 120,037 (H) <5 mIU/mL  Urinalysis, Routine w reflex microscopic -Urine, Clean Catch     Status: Abnormal   Collection Time: 07/16/23  8:30 PM  Result Value Ref Range   Color, Urine YELLOW YELLOW   APPearance HAZY (A) CLEAR   Specific Gravity, Urine 1.030 1.005 - 1.030   pH 5.0 5.0 - 8.0   Glucose, UA NEGATIVE NEGATIVE mg/dL   Hgb urine dipstick LARGE (A) NEGATIVE   Bilirubin Urine NEGATIVE NEGATIVE   Ketones, ur NEGATIVE NEGATIVE mg/dL   Protein, ur NEGATIVE NEGATIVE mg/dL   Nitrite NEGATIVE NEGATIVE   Leukocytes,Ua NEGATIVE NEGATIVE   RBC / HPF 6-10 0 - 5 RBC/hpf   WBC, UA 0-5 0 - 5 WBC/hpf   Bacteria, UA RARE (A) NONE SEEN   Squamous Epithelial / HPF 0-5 0 - 5 /HPF   Mucus PRESENT    Ca Oxalate Crys, UA PRESENT    US OB Comp Less 14 Wks  Result Date: 07/16/2023 CLINICAL DATA:  Vaginal bleeding EXAM: OBSTETRIC <14 WK ULTRASOUND TECHNIQUE: Transabdominal ultrasound was performed for evaluation of the gestation as well as the maternal uterus and adnexal regions. COMPARISON:  06/12/2023 FINDINGS: Intrauterine gestational sac: Single intrauterine gestational sac Yolk sac:   Not visualized Embryo:  Visualized Cardiac Activity: Visualized Heart Rate: 150 bpm CRL:   66.8 mm   13 w 0 d                  [place] EDC: 01/21/2024 Subchorionic hemorrhage:  None visualized. Maternal uterus/adnexae: Nabothian cysts in the cervix. On cine images, placenta may cover the  internal os. Bilateral adnexal cysts, on the left measuring up to 2.8 cm and on the right measuring up to 2 cm. Ovaries are otherwise normal. No significant free fluid IMPRESSION: 1. Single viable intrauterine pregnancy as above. 2. On some of the images, suggestion of placenta covering the internal os, attention on follow-up second trimester ultrasound Electronically Signed   By: Jasmine Pang M.D.   On: 07/16/2023 21:36    - Patient anemic  - [place] results revealed a single living IUP measuring ~ [redacted] weeks gestation. Also noted Placenta covering internal os and likely cause for vaginal bleeding.  - Plan for discharge.   Assessment and Plan   1. Vaginal bleeding   2. Placenta previa in second trimester   3. [redacted] weeks gestation of pregnancy   4. Anemia, unspecified type    - Reviewed [place] results with patient and FOB and likely Placenta Previa. Discussed possibility placental migration as pregnancy continues also possibility of C/S if migration does not occur.  - Plan to reassess placenta location at 20 weeks anatomy scan.  - Recommended that patient be on STRICT pelvic rest until next [place]. Patient verbalized understanding.  - Worsening signs and return precautions reviewed.  - ROB visit on 08/01/23 encouraged patient to keep apportionment . - Patient anemic. Rx for Ferrous sulfate ordered. Reviewed that if patient drops below she may need IV iron infusions.  - Patient discharged home in stable condition and may return to MAU.   Seaira Byus Danella Deis) Mary Portela, MSN, CNM  Center for [facility]  07/16/2023 10:32 PM

## 2023-07-23 LAB — HORIZON CUSTOM: REPORT SUMMARY: NEGATIVE

## 2023-08-01 ENCOUNTER — Ambulatory Visit (INDEPENDENT_AMBULATORY_CARE_PROVIDER_SITE_OTHER): Payer: BC Managed Care – PPO | Admitting: Family Medicine

## 2023-08-01 VITALS — BP 135/76 | HR 99 | Wt 197.8 lb

## 2023-08-01 DIAGNOSIS — Z3492 Encounter for supervision of normal pregnancy, unspecified, second trimester: Secondary | ICD-10-CM | POA: Diagnosis not present

## 2023-08-01 DIAGNOSIS — O4402 Placenta previa specified as without hemorrhage, second trimester: Secondary | ICD-10-CM

## 2023-08-01 DIAGNOSIS — Z3A15 15 weeks gestation of pregnancy: Secondary | ICD-10-CM

## 2023-08-01 DIAGNOSIS — D649 Anemia, unspecified: Secondary | ICD-10-CM

## 2023-08-01 DIAGNOSIS — Z3480 Encounter for supervision of other normal pregnancy, unspecified trimester: Secondary | ICD-10-CM

## 2023-08-01 DIAGNOSIS — N939 Abnormal uterine and vaginal bleeding, unspecified: Secondary | ICD-10-CM

## 2023-08-01 NOTE — Patient Instructions (Signed)

## 2023-08-01 NOTE — Progress Notes (Signed)
   PRENATAL VISIT NOTE  Subjective:  Mary Mora is a 32 y.o. [phone] at [redacted]w[redacted]d being seen today for ongoing prenatal care.  She is currently monitored for the following issues for this low-risk pregnancy and has Supervision of other normal pregnancy; Iron deficiency anemia; Indication for care in labor or delivery; NSVD (normal spontaneous vaginal delivery); Achalasia of cardia; and Supervision of other normal pregnancy, antepartum on their problem list.  Patient doing well with no acute concerns today. She reports  round liagment pain .  Contractions: Not present. Vag. Bleeding: Small (3 wks ago dark red clot the size of a fifty cent piece.).  Movement: Present. Denies leaking of fluid.   The following portions of the patient's history were reviewed and updated as appropriate: allergies, current medications, past family history, past medical history, past social history, past surgical history and problem list. Problem list updated.  Objective:   Vitals:   08/01/23 0905  BP: 135/76  Pulse: 99  Weight: 197 lb 12.8 oz (89.7 kg)    Fetal Status: Fetal Heart Rate (bpm): 153   Movement: Present     General:  Alert, oriented and cooperative. Patient is in no acute distress.  Skin: Skin is warm and dry. No rash noted.   Cardiovascular: Normal heart rate noted  Respiratory: Normal respiratory effort, no problems with respiration noted  Abdomen: Soft, gravid, appropriate for gestational age.  Pain/Pressure: Present     Pelvic: Cervical exam deferred        Extremities: Normal range of motion.  Edema: None  Mental Status:  Normal mood and affect. Normal behavior. Normal judgment and thought content.   Assessment and Plan:  Pregnancy: G4P2012 at [redacted]w[redacted]d  1. [redacted] weeks gestation of pregnancy - AFP, Serum, Open Spina Bifida  2. Supervision of other normal pregnancy, antepartum  3. Anemia, unspecified type - on iron supplementation, asymptomatic today   4. Placenta previa in second  trimester - pending 2nd trimester [place], but no VB today and compliant with strict pelvic rest   5. Vaginal bleeding, resolved    Preterm labor symptoms and general obstetric precautions including but not limited to vaginal bleeding, contractions, leaking of fluid and fetal movement were reviewed in detail with the patient.  Please refer to After Visit Summary for other counseling recommendations.   Return in about 4 weeks (around 08/29/2023) for ROB 20weeks .   Mittie Bodo, MD Family Medicine - Obstetrics Fellow

## 2023-08-03 LAB — AFP, SERUM, OPEN SPINA BIFIDA
AFP MoM: 1.22
AFP Value: 32.7 ng/mL
Gest. Age on Collection Date: 15 wk
Maternal Age At EDD: 33.6 a
OSBR Risk 1 IN: 10000
Test Results:: NEGATIVE
Weight: 197 [lb_av]

## 2023-08-15 DIAGNOSIS — O99210 Obesity complicating pregnancy, unspecified trimester: Secondary | ICD-10-CM | POA: Insufficient documentation

## 2023-08-24 ENCOUNTER — Other Ambulatory Visit: Payer: Self-pay | Admitting: Certified Nurse Midwife

## 2023-08-29 ENCOUNTER — Encounter: Payer: Self-pay | Admitting: Advanced Practice Midwife

## 2023-08-29 ENCOUNTER — Other Ambulatory Visit: Payer: Self-pay

## 2023-08-29 ENCOUNTER — Other Ambulatory Visit: Payer: Self-pay | Admitting: *Deleted

## 2023-08-29 ENCOUNTER — Ambulatory Visit (INDEPENDENT_AMBULATORY_CARE_PROVIDER_SITE_OTHER): Payer: BC Managed Care – PPO | Admitting: Advanced Practice Midwife

## 2023-08-29 ENCOUNTER — Ambulatory Visit: Payer: BC Managed Care – PPO | Admitting: *Deleted

## 2023-08-29 ENCOUNTER — Ambulatory Visit: Payer: BC Managed Care – PPO | Attending: Obstetrics and Gynecology

## 2023-08-29 VITALS — BP 107/66 | HR 87 | Wt 198.0 lb

## 2023-08-29 VITALS — BP 116/68 | HR 96

## 2023-08-29 DIAGNOSIS — N83202 Unspecified ovarian cyst, left side: Secondary | ICD-10-CM | POA: Insufficient documentation

## 2023-08-29 DIAGNOSIS — Z3480 Encounter for supervision of other normal pregnancy, unspecified trimester: Secondary | ICD-10-CM | POA: Diagnosis not present

## 2023-08-29 DIAGNOSIS — Z3A19 19 weeks gestation of pregnancy: Secondary | ICD-10-CM | POA: Diagnosis not present

## 2023-08-29 DIAGNOSIS — O99212 Obesity complicating pregnancy, second trimester: Secondary | ICD-10-CM

## 2023-08-29 DIAGNOSIS — Z363 Encounter for antenatal screening for malformations: Secondary | ICD-10-CM | POA: Insufficient documentation

## 2023-08-29 DIAGNOSIS — O4402 Placenta previa specified as without hemorrhage, second trimester: Secondary | ICD-10-CM

## 2023-08-29 DIAGNOSIS — Z362 Encounter for other antenatal screening follow-up: Secondary | ICD-10-CM

## 2023-08-29 DIAGNOSIS — D649 Anemia, unspecified: Secondary | ICD-10-CM

## 2023-08-29 DIAGNOSIS — O3482 Maternal care for other abnormalities of pelvic organs, second trimester: Secondary | ICD-10-CM | POA: Insufficient documentation

## 2023-08-29 DIAGNOSIS — O99210 Obesity complicating pregnancy, unspecified trimester: Secondary | ICD-10-CM

## 2023-08-29 DIAGNOSIS — O99012 Anemia complicating pregnancy, second trimester: Secondary | ICD-10-CM | POA: Diagnosis not present

## 2023-08-29 NOTE — Progress Notes (Signed)
   PRENATAL VISIT NOTE  Subjective:  Mary Mora is a 32 y.o. [phone] at [redacted]w[redacted]d being seen today for ongoing prenatal care.  She is currently monitored for the following issues for this low-risk pregnancy and has Achalasia of cardia; Supervision of other normal pregnancy, antepartum; and Obesity affecting pregnancy on their problem list.  Patient reports no complaints.  Contractions: Not present. Vag. Bleeding: None.  Movement: Present. Denies leaking of fluid.   The following portions of the patient's history were reviewed and updated as appropriate: allergies, current medications, past family history, past medical history, past social history, past surgical history and problem list.   Objective:   Vitals:   08/29/23 1048  BP: 107/66  Pulse: 87  Weight: 198 lb (89.8 kg)    Fetal Status: Fetal Heart Rate (bpm): 145   Movement: Present     General:  Alert, oriented and cooperative. Patient is in no acute distress.  Skin: Skin is warm and dry. No rash noted.   Cardiovascular: Normal heart rate noted  Respiratory: Normal respiratory effort, no problems with respiration noted  Abdomen: Soft, gravid, appropriate for gestational age.  Pain/Pressure: Absent     Pelvic: Cervical exam deferred        Extremities: Normal range of motion.  Edema: None  Mental Status: Normal mood and affect. Normal behavior. Normal judgment and thought content.   Assessment and Plan:  Pregnancy: [id number] at [redacted]w[redacted]d 1. Supervision of other normal pregnancy, antepartum --Anticipatory guidance about next visits/weeks of pregnancy given.   2. Anemia, unspecified type --Hgb 9.0 on 9/22, iron studies on 11/5, consider Venofer  3. Placenta previa in second trimester --Resolved, not seen on 19 week anatomy US  4. [redacted] weeks gestation of pregnancy   Preterm labor symptoms and general obstetric precautions including but not limited to vaginal bleeding, contractions, leaking of fluid and fetal movement were  reviewed in detail with the patient. Please refer to After Visit Summary for other counseling recommendations.   Return in about 2 weeks (around 09/12/2023) for Midwife preferred.  Future Appointments  Date Time Provider Department Center  09/15/2023  9:35 AM Carlynn Herald, CNM [facility] None  10/06/2023  2:30 PM [facility] US5 [facility]    Sharen Counter, CNM

## 2023-09-15 ENCOUNTER — Ambulatory Visit (INDEPENDENT_AMBULATORY_CARE_PROVIDER_SITE_OTHER): Payer: BC Managed Care – PPO | Admitting: Certified Nurse Midwife

## 2023-09-15 ENCOUNTER — Telehealth: Payer: Self-pay

## 2023-09-15 VITALS — BP 114/74 | HR 103 | Wt 194.4 lb

## 2023-09-15 DIAGNOSIS — Z3A21 21 weeks gestation of pregnancy: Secondary | ICD-10-CM

## 2023-09-15 DIAGNOSIS — Z3492 Encounter for supervision of normal pregnancy, unspecified, second trimester: Secondary | ICD-10-CM

## 2023-09-15 DIAGNOSIS — D649 Anemia, unspecified: Secondary | ICD-10-CM | POA: Insufficient documentation

## 2023-09-15 DIAGNOSIS — O99019 Anemia complicating pregnancy, unspecified trimester: Secondary | ICD-10-CM | POA: Insufficient documentation

## 2023-09-15 DIAGNOSIS — N949 Unspecified condition associated with female genital organs and menstrual cycle: Secondary | ICD-10-CM

## 2023-09-15 NOTE — Telephone Encounter (Signed)
Auth Submission: NO AUTH NEEDED Site of care: Site of care: [facility] Payer: BCBS Medication & CPT/J Code(s) submitted: Venofer (Iron Sucrose) J1756 Route of submission (phone, fax, portal):  Phone # Fax # Auth type: Buy/Bill PB Units/visits requested: 200mg  x 5 doses Reference number:  Approval from: 09/15/23 to 10/24/23

## 2023-09-15 NOTE — Patient Instructions (Signed)

## 2023-09-15 NOTE — Progress Notes (Signed)
   PRENATAL VISIT NOTE  Subjective:  Mary Mora is a 32 y.o. [phone] at [redacted]w[redacted]d being seen today for ongoing prenatal care.  She is currently monitored for the following issues for this low-risk pregnancy and has Achalasia of cardia; Supervision of other normal pregnancy, antepartum; Obesity affecting pregnancy; and Anemia on their problem list.  Patient reports  intermittent pressure when changing positions and going from sitting to standing  .  Contractions: Not present. Vag. Bleeding: None.  Movement: Present. Denies leaking of fluid.   The following portions of the patient's history were reviewed and updated as appropriate: allergies, current medications, past family history, past medical history, past social history, past surgical history and problem list.   Objective:   Vitals:   09/15/23 0936  BP: 114/74  Pulse: (!) 103  Weight: 194 lb 6.4 oz (88.2 kg)    Fetal Status: Fetal Heart Rate (bpm): 149   Movement: Present     General:  Alert, oriented and cooperative. Patient is in no acute distress.  Skin: Skin is warm and dry. No rash noted.   Cardiovascular: Normal heart rate noted  Respiratory: Normal respiratory effort, no problems with respiration noted  Abdomen: Soft, gravid, appropriate for gestational age.  Pain/Pressure: Absent     Pelvic: Cervical exam deferred        Extremities: Normal range of motion.  Edema: None  Mental Status: Normal mood and affect. Normal behavior. Normal judgment and thought content.   Assessment and Plan:  Pregnancy: [id number] at [redacted]w[redacted]d 1. Encounter for supervision of low-risk pregnancy in second trimester - Patient overall doing well.  - Reports vigorous and frequent fetal movement.   2. [redacted] weeks gestation of pregnancy - Discussed 2nd Trimester Expectations.   3. Anemia, unspecified type - Reviewed previous levels for Iron and recommended a Iron infusion.  - Patient agreeable to plan of care.  - referral placed for the infusion  clinic.   4. Round ligament pain - Reviewed comfort measures and recommended the use of a maternity support belt closer to 3rd trimester.   Preterm labor symptoms and general obstetric precautions including but not limited to vaginal bleeding, contractions, leaking of fluid and fetal movement were reviewed in detail with the patient. Please refer to After Visit Summary for other counseling recommendations.   Return in about 4 weeks (around 10/13/2023) for LOB.  Future Appointments  Date Time Provider Department Center  10/06/2023  2:30 PM [facility] US5 [facility]  10/13/2023  8:45 AM [facility] LAB [facility] None  10/13/2023 11:15 AM Gerrit Heck, CNM [facility] None    Mary Winstanley (Danella Deis) Suzie Portela, MSN, CNM  Center for [facility]  09/15/2023 10:25 AM

## 2023-10-03 ENCOUNTER — Ambulatory Visit: Payer: BC Managed Care – PPO

## 2023-10-03 VITALS — BP 114/73 | HR 103 | Temp 98.3°F | Resp 18 | Ht 62.0 in | Wt 195.2 lb

## 2023-10-03 DIAGNOSIS — D649 Anemia, unspecified: Secondary | ICD-10-CM | POA: Diagnosis not present

## 2023-10-03 MED ORDER — DIPHENHYDRAMINE HCL 25 MG PO CAPS
25.0000 mg | ORAL_CAPSULE | Freq: Once | ORAL | Status: AC
  Administered 2023-10-03: 25 mg via ORAL
  Filled 2023-10-03: qty 1

## 2023-10-03 MED ORDER — ACETAMINOPHEN 325 MG PO TABS
650.0000 mg | ORAL_TABLET | Freq: Once | ORAL | Status: AC
Start: 2023-10-03 — End: 2023-10-03
  Administered 2023-10-03: 650 mg via ORAL
  Filled 2023-10-03: qty 2

## 2023-10-03 MED ORDER — IRON SUCROSE 20 MG/ML IV SOLN
200.0000 mg | Freq: Once | INTRAVENOUS | Status: AC
Start: 2023-10-03 — End: 2023-10-03
  Administered 2023-10-03: 200 mg via INTRAVENOUS
  Filled 2023-10-03: qty 10

## 2023-10-03 NOTE — Progress Notes (Signed)
 Diagnosis: Iron Deficiency Anemia  Provider:  Chilton Greathouse MD  Procedure: IV Push  IV Type: Peripheral, IV Location: L Antecubital  Venofer (Iron Sucrose), Dose: 200 mg  Post Infusion IV Care: Observation period completed and Peripheral IV Discontinued  Discharge: Condition: Good, Destination: Home . AVS Declined  Performed by:  Loney Hering, LPN

## 2023-10-05 ENCOUNTER — Ambulatory Visit: Payer: BC Managed Care – PPO

## 2023-10-05 VITALS — BP 112/72 | HR 82 | Temp 98.2°F | Resp 16 | Ht 62.0 in | Wt 197.6 lb

## 2023-10-05 DIAGNOSIS — D649 Anemia, unspecified: Secondary | ICD-10-CM | POA: Diagnosis not present

## 2023-10-05 MED ORDER — IRON SUCROSE 20 MG/ML IV SOLN
200.0000 mg | Freq: Once | INTRAVENOUS | Status: AC
  Administered 2023-10-05: 200 mg via INTRAVENOUS
  Filled 2023-10-05: qty 10

## 2023-10-05 MED ORDER — DIPHENHYDRAMINE HCL 25 MG PO CAPS
25.0000 mg | ORAL_CAPSULE | Freq: Once | ORAL | Status: AC
  Administered 2023-10-05: 25 mg via ORAL
  Filled 2023-10-05: qty 1

## 2023-10-05 MED ORDER — ACETAMINOPHEN 325 MG PO TABS
650.0000 mg | ORAL_TABLET | Freq: Once | ORAL | Status: AC
  Administered 2023-10-05: 650 mg via ORAL
  Filled 2023-10-05: qty 2

## 2023-10-05 NOTE — Progress Notes (Signed)
 Diagnosis: Iron Deficiency Anemia  Provider:  Chilton Greathouse MD  Procedure: IV Push  IV Type: Peripheral, IV Location: L Antecubital  Venofer (Iron Sucrose), Dose: 200 mg  Post Infusion IV Care: Observation period completed and Peripheral IV Discontinued  Discharge: Condition: Good, Destination: Home . AVS Declined  Performed by:  Rico Ala, LPN

## 2023-10-06 ENCOUNTER — Ambulatory Visit: Payer: BC Managed Care – PPO | Attending: Obstetrics and Gynecology

## 2023-10-06 DIAGNOSIS — E669 Obesity, unspecified: Secondary | ICD-10-CM

## 2023-10-06 DIAGNOSIS — Z3A24 24 weeks gestation of pregnancy: Secondary | ICD-10-CM | POA: Diagnosis not present

## 2023-10-06 DIAGNOSIS — O99212 Obesity complicating pregnancy, second trimester: Secondary | ICD-10-CM

## 2023-10-06 DIAGNOSIS — Z362 Encounter for other antenatal screening follow-up: Secondary | ICD-10-CM

## 2023-10-08 MED ORDER — IRON SUCROSE 20 MG/ML IV SOLN
200.0000 mg | Freq: Once | INTRAVENOUS | Status: AC
  Administered 2023-10-09: 200 mg via INTRAVENOUS
  Filled 2023-10-08: qty 10

## 2023-10-08 MED ORDER — ACETAMINOPHEN 325 MG PO TABS
650.0000 mg | ORAL_TABLET | Freq: Once | ORAL | Status: AC
  Administered 2023-10-09: 650 mg via ORAL
  Filled 2023-10-08: qty 2

## 2023-10-08 MED ORDER — DIPHENHYDRAMINE HCL 25 MG PO CAPS
25.0000 mg | ORAL_CAPSULE | Freq: Once | ORAL | Status: AC
  Administered 2023-10-09: 25 mg via ORAL
  Filled 2023-10-08: qty 1

## 2023-10-09 ENCOUNTER — Ambulatory Visit: Payer: BC Managed Care – PPO

## 2023-10-09 VITALS — BP 114/76 | HR 86 | Temp 97.4°F | Resp 18 | Ht 62.0 in | Wt 199.8 lb

## 2023-10-09 DIAGNOSIS — D649 Anemia, unspecified: Secondary | ICD-10-CM | POA: Diagnosis not present

## 2023-10-09 NOTE — Progress Notes (Signed)
Diagnosis: Iron Deficiency Anemia  Provider:  Chilton Greathouse MD  Procedure: IV Push  IV Type: Peripheral, IV Location: L Antecubital  Venofer (Iron Sucrose), Dose: 200 mg  Post Infusion IV Care:  Pt waited 15 minutes for observation.  Discharge: Condition: Good, Destination: Home . AVS Declined  Performed by:  Garnette Czech, RN

## 2023-10-11 ENCOUNTER — Ambulatory Visit: Payer: BC Managed Care – PPO

## 2023-10-11 MED ORDER — IRON SUCROSE 20 MG/ML IV SOLN: 200.0000 mg | Freq: Once | INTRAVENOUS | Status: DC

## 2023-10-11 MED ORDER — ACETAMINOPHEN 325 MG PO TABS
650.0000 mg | ORAL_TABLET | Freq: Once | ORAL | Status: DC
Start: 2023-10-11 — End: 2023-10-11

## 2023-10-11 MED ORDER — DIPHENHYDRAMINE HCL 25 MG PO CAPS
25.0000 mg | ORAL_CAPSULE | Freq: Once | ORAL | Status: DC
Start: 2023-10-11 — End: 2023-10-11

## 2023-10-13 ENCOUNTER — Other Ambulatory Visit: Payer: BC Managed Care – PPO

## 2023-10-13 ENCOUNTER — Ambulatory Visit: Payer: BC Managed Care – PPO

## 2023-10-13 ENCOUNTER — Ambulatory Visit (INDEPENDENT_AMBULATORY_CARE_PROVIDER_SITE_OTHER): Payer: BC Managed Care – PPO

## 2023-10-13 ENCOUNTER — Encounter: Payer: Self-pay | Admitting: Certified Nurse Midwife

## 2023-10-13 VITALS — BP 117/72 | HR 89 | Wt 198.6 lb

## 2023-10-13 VITALS — BP 99/58 | HR 83 | Temp 98.3°F | Resp 24 | Ht 62.0 in | Wt 199.6 lb

## 2023-10-13 DIAGNOSIS — Z3A25 25 weeks gestation of pregnancy: Secondary | ICD-10-CM | POA: Diagnosis not present

## 2023-10-13 DIAGNOSIS — Z3492 Encounter for supervision of normal pregnancy, unspecified, second trimester: Secondary | ICD-10-CM | POA: Diagnosis not present

## 2023-10-13 DIAGNOSIS — D649 Anemia, unspecified: Secondary | ICD-10-CM

## 2023-10-13 DIAGNOSIS — O99112 Other diseases of the blood and blood-forming organs and certain disorders involving the immune mechanism complicating pregnancy, second trimester: Secondary | ICD-10-CM

## 2023-10-13 MED ORDER — IRON SUCROSE 20 MG/ML IV SOLN
200.0000 mg | Freq: Once | INTRAVENOUS | Status: AC
  Administered 2023-10-13: 200 mg via INTRAVENOUS
  Filled 2023-10-13: qty 10

## 2023-10-13 MED ORDER — DIPHENHYDRAMINE HCL 25 MG PO CAPS
25.0000 mg | ORAL_CAPSULE | Freq: Once | ORAL | Status: AC
  Administered 2023-10-13: 25 mg via ORAL
  Filled 2023-10-13: qty 1

## 2023-10-13 MED ORDER — ACETAMINOPHEN 325 MG PO TABS
650.0000 mg | ORAL_TABLET | Freq: Once | ORAL | Status: AC
  Administered 2023-10-13: 650 mg via ORAL
  Filled 2023-10-13: qty 2

## 2023-10-13 NOTE — Progress Notes (Signed)
Pt presents for ROB visit. No concerns.  

## 2023-10-13 NOTE — Progress Notes (Signed)
   LOW-RISK PREGNANCY OFFICE VISIT  Patient name: Mary Mora MRN [id number]  Date of birth: [birth year] Chief Complaint:   Routine Prenatal Visit  Subjective:   Mary Mora is a 32 y.o. [id number] female at [redacted]w[redacted]d with an Estimated Date of Delivery: 01/23/24 being seen today for ongoing management of a low-risk pregnancy aeb has Achalasia of cardia; Supervision of other normal pregnancy, antepartum; Obesity affecting pregnancy; and Anemia on their problem list.  Patient presents today, alone, with no complaints.  Patient endorses fetal movement. Patient denies abdominal cramping or contractions.  Patient denies vaginal concerns including abnormal discharge, leaking of fluid, and bleeding. No issues with urination, constipation, or diarrhea.    Contractions: Not present. Vag. Bleeding: None.  Movement: Present.  Reviewed past medical,surgical, social, obstetrical and family history as well as problem list, medications and allergies.  Objective   Vitals:   10/13/23 0950  BP: 117/72  Pulse: 89  Weight: 198 lb 9.6 oz (90.1 kg)  Body mass index is 36.32 kg/m.  Total Weight Gain:13 lb 9.6 oz (6.169 kg)         Physical Examination:   General appearance: Well appearing, and in no distress  Mental status: Alert, oriented to person, place, and time  Skin: Warm & dry  Cardiovascular: Normal heart rate noted  Respiratory: Normal respiratory effort, no distress  Abdomen: Soft, gravid, nontender, AGA with Fundal Height: 27 cm  Pelvic: Cervical exam deferred           Extremities: Edema: None  Fetal Status: Fetal Heart Rate (bpm): 137  Movement: Present   No results found for this or any previous visit (from the past 24 hours).  Assessment & Plan:  Low-risk pregnancy of a 32 y.o., [id number] at [redacted]w[redacted]d with an Estimated Date of Delivery: 01/23/24   1. Encounter for supervision of low-risk pregnancy in second trimester -Anticipatory guidance for upcoming appts. -Patient to schedule next  appt in 2-4 weeks for an in-person visit.   2. [redacted] weeks gestation of pregnancy -Glucola appt completed today albeit early draw already in process, so decision to continue made. -Hgb A1C normal at 5.5 -Reviewed blood draw procedures and labs which also include check of iron/HgB level, RPR, and HIV *Informed that repeat RPR/HIV are for pediatric records/compliance.  -Discussed how results of GTT are handled including diabetic education and BS testing for abnormal results and routine care for normal results.    3. Anemia, unspecified type -Has iron infusion today. -Scheduled for one more.  -Plan to repeat H&H between 32-34 weeks.     Meds: No orders of the defined types were placed in this encounter.  Labs/procedures today: Lab Orders         Glucose Tolerance, 2 Hours w/1 Hour         CBC         RPR         HIV antibody (with reflex)      Reviewed: Preterm labor symptoms and general obstetric precautions including but not limited to vaginal bleeding, contractions, leaking of fluid and fetal movement were reviewed in detail with the patient.  All questions were answered.  Follow-up: No follow-ups on file.  Orders Placed This Encounter  Procedures   Glucose Tolerance, 2 Hours w/1 Hour   CBC   RPR   HIV antibody (with reflex)   Cherre Robins MSN, CNM 10/13/2023

## 2023-10-13 NOTE — Progress Notes (Signed)
 Diagnosis: Acute Anemia  Provider:  Chilton Greathouse MD  Procedure: IV Push  IV Type: Peripheral, IV Location: R Antecubital  Venofer (Iron Sucrose), Dose: 200 mg  Post Infusion IV Care: Observation period completed and Peripheral IV Discontinued  Discharge: Condition: Good, Destination: Home . AVS Declined  Performed by:  Nat Math, RN

## 2023-10-14 LAB — RPR: RPR Ser Ql: NONREACTIVE

## 2023-10-14 LAB — CBC
Hematocrit: 29.8 % — ABNORMAL LOW (ref 34.0–46.6)
Hemoglobin: 9.2 g/dL — ABNORMAL LOW (ref 11.1–15.9)
MCH: 22.3 pg — ABNORMAL LOW (ref 26.6–33.0)
MCHC: 30.9 g/dL — ABNORMAL LOW (ref 31.5–35.7)
MCV: 72 fL — ABNORMAL LOW (ref 79–97)
Platelets: 234 10*3/uL (ref 150–450)
RBC: 4.13 x10E6/uL (ref 3.77–5.28)
RDW: 23.3 % — ABNORMAL HIGH (ref 11.7–15.4)
WBC: 7.5 10*3/uL (ref 3.4–10.8)

## 2023-10-14 LAB — GLUCOSE TOLERANCE, 2 HOURS W/ 1HR
Glucose, 1 hour: 125 mg/dL (ref 70–179)
Glucose, 2 hour: 91 mg/dL (ref 70–152)
Glucose, Fasting: 89 mg/dL (ref 70–91)

## 2023-10-14 LAB — HIV ANTIBODY (ROUTINE TESTING W REFLEX): HIV Screen 4th Generation wRfx: NONREACTIVE

## 2023-10-21 NOTE — Addendum Note (Signed)
Addended by: Gerrit Heck L on: 10/21/2023 11:06 AM   Modules accepted: Orders

## 2023-10-23 ENCOUNTER — Ambulatory Visit (INDEPENDENT_AMBULATORY_CARE_PROVIDER_SITE_OTHER): Payer: BC Managed Care – PPO

## 2023-10-23 VITALS — BP 118/80 | HR 79 | Temp 98.2°F | Resp 20 | Ht 62.0 in | Wt 201.0 lb

## 2023-10-23 DIAGNOSIS — D649 Anemia, unspecified: Secondary | ICD-10-CM

## 2023-10-23 MED ORDER — ACETAMINOPHEN 325 MG PO TABS
650.0000 mg | ORAL_TABLET | Freq: Once | ORAL | Status: AC
  Administered 2023-10-23: 650 mg via ORAL
  Filled 2023-10-23: qty 2

## 2023-10-23 MED ORDER — IRON SUCROSE 20 MG/ML IV SOLN
200.0000 mg | Freq: Once | INTRAVENOUS | Status: AC
  Administered 2023-10-23: 200 mg via INTRAVENOUS
  Filled 2023-10-23: qty 10

## 2023-10-23 MED ORDER — DIPHENHYDRAMINE HCL 25 MG PO CAPS
25.0000 mg | ORAL_CAPSULE | Freq: Once | ORAL | Status: AC
  Administered 2023-10-23: 25 mg via ORAL
  Filled 2023-10-23: qty 1

## 2023-10-23 NOTE — Progress Notes (Signed)
Diagnosis: Acute Anemia  Provider:  Chilton Greathouse MD  Procedure: IV Push  IV Type: Peripheral, IV Location: R Antecubital  Venofer (Iron Sucrose), Dose: 200 mg  Post Infusion IV Care: Observation period completed and Peripheral IV Discontinued  Discharge: Condition: Stable, Destination: Home . AVS Declined  Performed by:  Wyvonne Lenz, RN

## 2023-10-25 NOTE — L&D Delivery Note (Signed)
 Delivery Note Mary Mora is a 33 y.o. [id number] at [redacted]w[redacted]d admitted for labor.   GBS Status:  Negative/-- (03/03 1427)  Labor course: Initial SVE: 3/70/-2. Augmentation with: none. She then progressed to complete.  ROM: rupture date, rupture time, delivery date, or delivery time have not been documented with clear fluid  Birth: After a very brief 2nd stage, she delivered a Live born female  Birth Weight:   APGAR: 8,   Newborn Delivery   Birth date/time: 01/25/2025 23:40:00 Delivery type: Vaginal, Spontaneous       Immediately after receiving epidural, FHR declerated to 70s and pt had urge to push. Had felt a "pop" while getting epidural, SROM w/clear fluid. PT noted to be C/C/+2 Delivered via spontaneous vaginal delivery (Presentation: LOA ). Nuchal cord present: No. . Shoulders and body delivered in usual fashion. Infant placed directly on mom's abdomen for bonding/skin-to-skin, baby dried and stimulated. Cord clamped x 2 after 1 minute and cut by FOB.  Cord blood collected. Placenta delivered-Spontaneous with 3 vessels. 20u Pitocin in 500cc LR given as a bolus prior to delivery of placenta.  Fundus firm with massage. Placenta inspected and appears to be intact with a 3 VC.  Sponge and instrument count were correct x2.  Intrapartum complications:  None Anesthesia:  epidural (although never set up) Lacerations:  none Suture Repair:  EBL (mL):165.00    Mom to postpartum.  Baby to Couplet care / Skin to Skin. Placenta to L&D   Plans to Breastfeed Contraception:  unsure Circumcision: N/A  Note sent to [facility]: [facility] for pp visit.  Delivery Report:  Review the Delivery Report for details.     Signed: Jacklyn Shell, DNP,CNM 01/25/2024, 11:53 PM

## 2023-11-03 ENCOUNTER — Other Ambulatory Visit: Payer: Self-pay

## 2023-11-03 ENCOUNTER — Encounter (HOSPITAL_COMMUNITY): Payer: Self-pay | Admitting: Obstetrics and Gynecology

## 2023-11-03 ENCOUNTER — Inpatient Hospital Stay (HOSPITAL_COMMUNITY)
Admission: AD | Admit: 2023-11-03 | Discharge: 2023-11-03 | Disposition: A | Discharge status: Home / Self Care | Payer: BC Managed Care – PPO | Attending: Obstetrics & Gynecology | Admitting: Obstetrics & Gynecology

## 2023-11-03 DIAGNOSIS — R519 Headache, unspecified: Secondary | ICD-10-CM | POA: Insufficient documentation

## 2023-11-03 DIAGNOSIS — Z3A28 28 weeks gestation of pregnancy: Secondary | ICD-10-CM | POA: Diagnosis not present

## 2023-11-03 DIAGNOSIS — O26893 Other specified pregnancy related conditions, third trimester: Secondary | ICD-10-CM | POA: Insufficient documentation

## 2023-11-03 LAB — URINALYSIS, ROUTINE W REFLEX MICROSCOPIC
Bilirubin Urine: NEGATIVE
Glucose, UA: NEGATIVE mg/dL
Hgb urine dipstick: NEGATIVE
Ketones, ur: 80 mg/dL — AB
Leukocytes,Ua: NEGATIVE
Nitrite: NEGATIVE
Protein, ur: 30 mg/dL — AB
Specific Gravity, Urine: 1.024 (ref 1.005–1.030)
pH: 5 (ref 5.0–8.0)

## 2023-11-03 MED ORDER — CYCLOBENZAPRINE HCL 10 MG PO TABS: 10.0000 mg | ORAL_TABLET | Freq: Two times a day (BID) | ORAL | 0 refills | Status: DC | PRN

## 2023-11-03 MED ORDER — ACETAMINOPHEN-CAFFEINE 500-65 MG PO TABS
2.0000 | ORAL_TABLET | Freq: Once | ORAL | Status: AC
Start: 2023-11-03 — End: 2023-11-03
  Administered 2023-11-03: 2 via ORAL
  Filled 2023-11-03: qty 2

## 2023-11-03 MED ORDER — CYCLOBENZAPRINE HCL 5 MG PO TABS
10.0000 mg | ORAL_TABLET | Freq: Once | ORAL | Status: AC
  Administered 2023-11-03: 10 mg via ORAL
  Filled 2023-11-03: qty 2

## 2023-11-03 MED ORDER — METOCLOPRAMIDE HCL 10 MG PO TABS
10.0000 mg | ORAL_TABLET | Freq: Once | ORAL | Status: AC
  Administered 2023-11-03: 10 mg via ORAL
  Filled 2023-11-03: qty 1

## 2023-11-03 NOTE — Discharge Instructions (Signed)

## 2023-11-03 NOTE — MAU Provider Note (Signed)
 History     CSN: [id number]  Arrival date and time: 11/03/23 1545   Event Date/Time   First Provider Initiated Contact with Patient 11/03/23 1630      Chief Complaint  Patient presents with   Headache   HPI  Mary Mora is a 33 y.o. [id number] at [redacted]w[redacted]d who presents for evaluation of a headache. Patient reports she has had a headache for 3 days. She tried tyelnol at 0400 with no relief. Patient rates the pain as a 5/10. She reports for the last 3 days she has felt like being lazy and has been laying around without eating or drinking as much as usual. She denies any vaginal bleeding, discharge, and leaking of fluid. Denies any constipation, diarrhea or any urinary complaints. Reports normal fetal movement.   OB History     Gravida  4   Para  2   Term  2   Preterm      AB  1   Living  2      SAB      IAB  1   Ectopic      Multiple  0   Live Births  2           Past Medical History:  Diagnosis Date   Acid reflux    BV (bacterial vaginosis)    Heart burn    Infection    No pertinent past medical history     Past Surgical History:  Procedure Laterality Date   ESOPHAGEAL MANOMETRY N/A 06/28/2017   Procedure: ESOPHAGEAL MANOMETRY (EM);  Surgeon: Elicia Claw, MD;  Location: [facility];  Service: Gastroenterology;  Laterality: N/A;    Family History  Problem Relation Age of Onset   Hypertension Mother    Diabetes Father    Hypertension Father    Asthma Son    Cancer Maternal Grandmother    Diabetes Maternal Grandmother    Kidney disease Maternal Grandmother        on dialysis   Hypertension Paternal Grandmother    Aneurysm Paternal Grandmother     Social History   Tobacco Use   Smoking status: Never    Passive exposure: Never   Smokeless tobacco: Never  Vaping Use   Vaping status: Never Used  Substance Use Topics   Alcohol use: No   Drug use: No    Allergies: No Known Allergies  Medications Prior to Admission  Medication  Sig Dispense Refill Last Dose/Taking   ferrous sulfate  325 (65 FE) MG tablet TAKE 1 TABLET BY MOUTH EVERY DAY 30 tablet 0    omeprazole  (PRILOSEC) 20 MG capsule Take 20 mg by mouth daily.   More than a month   Prenatal Vit-Fe Fumarate-FA (PRENATAL VITAMIN PO) Take 1 tablet by mouth daily.   11/01/2023    Review of Systems  Constitutional: Negative.  Negative for chills, fatigue and fever.  HENT: Negative.    Respiratory: Negative.  Negative for shortness of breath.   Cardiovascular: Negative.  Negative for chest pain.  Gastrointestinal: Negative.  Negative for abdominal pain, constipation, diarrhea, nausea and vomiting.  Genitourinary: Negative.  Negative for dysuria, vaginal bleeding and vaginal discharge.  Neurological:  Positive for headaches. Negative for dizziness.   Physical Exam   Blood pressure 130/71, pulse 88, temperature 97.6 F (36.4 C), temperature source Oral, resp. rate 19, height 5' 2 (1.575 m), weight 90.1 kg, last menstrual period 04/18/2023, SpO2 99%.  Patient Vitals for the past 24 hrs:  BP Temp Temp  src Pulse Resp SpO2 Height Weight  11/03/23 1629 130/71 -- -- 88 -- 99 % -- --  11/03/23 1607 134/70 97.6 F (36.4 C) Oral 91 19 99 % -- --  11/03/23 1603 -- -- -- -- -- -- 5' 2 (1.575 m) 90.1 kg    Physical Exam Vitals and nursing note reviewed.  Constitutional:      General: She is not in acute distress.    Appearance: She is well-developed.  HENT:     Head: Normocephalic.  Eyes:     Pupils: Pupils are equal, round, and reactive to light.  Cardiovascular:     Rate and Rhythm: Normal rate and regular rhythm.     Heart sounds: Normal heart sounds.  Pulmonary:     Effort: Pulmonary effort is normal. No respiratory distress.     Breath sounds: Normal breath sounds.  Abdominal:     General: Bowel sounds are normal. There is no distension.     Palpations: Abdomen is soft.     Tenderness: There is no abdominal tenderness.  Skin:    General: Skin is warm and  dry.  Neurological:     Mental Status: She is alert and oriented to person, place, and time.  Psychiatric:        Mood and Affect: Mood normal.        Behavior: Behavior normal.        Thought Content: Thought content normal.        Judgment: Judgment normal.     Fetal Tracing:  Baseline: 130 Variability: moderate Accels: 15x15 Decels: none  Toco: none  MAU Course  Procedures  Results for orders placed or performed during the hospital encounter of 11/03/23 (from the past 24 hours)  Urinalysis, Routine w reflex microscopic -Urine, Clean Catch     Status: Abnormal   Collection Time: 11/03/23  4:33 PM  Result Value Ref Range   Color, Urine AMBER (A) YELLOW   APPearance HAZY (A) CLEAR   Specific Gravity, Urine 1.024 1.005 - 1.030   pH 5.0 5.0 - 8.0   Glucose, UA NEGATIVE NEGATIVE mg/dL   Hgb urine dipstick NEGATIVE NEGATIVE   Bilirubin Urine NEGATIVE NEGATIVE   Ketones, ur 80 (A) NEGATIVE mg/dL   Protein, ur 30 (A) NEGATIVE mg/dL   Nitrite NEGATIVE NEGATIVE   Leukocytes,Ua NEGATIVE NEGATIVE   RBC / HPF 0-5 0 - 5 RBC/hpf   WBC, UA 0-5 0 - 5 WBC/hpf   Bacteria, UA FEW (A) NONE SEEN   Squamous Epithelial / HPF 11-20 0 - 5 /HPF   Mucus PRESENT     MDM Labs ordered and reviewed.   UA Excedrin Tension/Reglan - minimal relief at reassessment Flexeril   Patient reports feeling better and wanting to leave to go eat. Discussed importance of proper nutrition and PO hydration in pregnancy.   Assessment and Plan   1. Pregnancy headache in third trimester   2. [redacted] weeks gestation of pregnancy     -Discharge home in stable condition -Rx for flexeril  sent to pharmacy -Third trimester precautions discussed -Patient advised to follow-up with OB as scheduled for prenatal care -Patient may return to MAU as needed or if her condition were to change or worsen  Aleck CHRISTELLA Fireman, CNM 11/03/2023, 4:30 PM

## 2023-11-03 NOTE — MAU Note (Signed)
 Mary Mora is a 33 y.o. at [redacted]w[redacted]d here in MAU reporting: she's had a HA for 3 days.  Reports she's been taking Tylenol , last taken @ 0400.  States hasn't had any relief after taking Tylenol .  Denies visual disturbances and epigastric pain. Denies VB or LOF.  Reports +FM.  LMP: NA Onset of complaint: 3 days Pain score: 8 Vitals:   11/03/23 1607  BP: 134/70  Pulse: 91  Resp: 19  Temp: 97.6 F (36.4 C)  SpO2: 99%     FHT:141 bpm Lab orders placed from triage: UA

## 2023-11-13 ENCOUNTER — Encounter: Payer: Self-pay | Admitting: Obstetrics and Gynecology

## 2023-11-13 ENCOUNTER — Ambulatory Visit (INDEPENDENT_AMBULATORY_CARE_PROVIDER_SITE_OTHER): Payer: BC Managed Care – PPO | Admitting: Obstetrics and Gynecology

## 2023-11-13 VITALS — BP 110/73 | HR 86 | Wt 200.0 lb

## 2023-11-13 DIAGNOSIS — Z3480 Encounter for supervision of other normal pregnancy, unspecified trimester: Secondary | ICD-10-CM

## 2023-11-13 DIAGNOSIS — D509 Iron deficiency anemia, unspecified: Secondary | ICD-10-CM

## 2023-11-13 DIAGNOSIS — O99210 Obesity complicating pregnancy, unspecified trimester: Secondary | ICD-10-CM

## 2023-11-13 NOTE — Progress Notes (Signed)
   PRENATAL VISIT NOTE  Subjective:  Mary Mora is a 33 y.o. [phone] at [redacted]w[redacted]d being seen today for ongoing prenatal care.  She is currently monitored for the following issues for this low-risk pregnancy and has Achalasia of cardia; Supervision of other normal pregnancy, antepartum; Obesity affecting pregnancy; and Anemia on their problem list.  Patient reports no complaints.  Contractions: Not present. Vag. Bleeding: None.  Movement: Present. Denies leaking of fluid.   The following portions of the patient's history were reviewed and updated as appropriate: allergies, current medications, past family history, past medical history, past social history, past surgical history and problem list.   Objective:   Vitals:   11/13/23 1000  BP: 110/73  Pulse: 86  Weight: 200 lb (90.7 kg)    Fetal Status: Fetal Heart Rate (bpm): 140 Fundal Height: 30 cm Movement: Present     General:  Alert, oriented and cooperative. Patient is in no acute distress.  Skin: Skin is warm and dry. No rash noted.   Cardiovascular: Normal heart rate noted  Respiratory: Normal respiratory effort, no problems with respiration noted  Abdomen: Soft, gravid, appropriate for gestational age.  Pain/Pressure: Absent     Pelvic: Cervical exam deferred        Extremities: Normal range of motion.     Mental Status: Normal mood and affect. Normal behavior. Normal judgment and thought content.   Assessment and Plan:  Pregnancy: [id number] at [redacted]w[redacted]d 1. Supervision of other normal pregnancy, antepartum (Primary) Patient is doing well without complaints Normal glucola last visit Patient desires BTL- private insurance Patient is still contemplating waterbirth and needs to take the class  2. Obesity affecting pregnancy, antepartum, unspecified obesity type Appropriate weight gain  3. Iron deficiency anemia, unspecified iron deficiency anemia type Patient completed iron infusion Continue oral iron supplement CBC next  visit  Preterm labor symptoms and general obstetric precautions including but not limited to vaginal bleeding, contractions, leaking of fluid and fetal movement were reviewed in detail with the patient. Please refer to After Visit Summary for other counseling recommendations.   Return in about 2 weeks (around 11/27/2023) for in person, ROB, Low risk.  No future appointments.  Catalina Antigua, MD

## 2023-11-13 NOTE — Progress Notes (Signed)
Pt is interested in BTL - will sign forms today.

## 2023-11-27 ENCOUNTER — Ambulatory Visit (INDEPENDENT_AMBULATORY_CARE_PROVIDER_SITE_OTHER): Payer: BC Managed Care – PPO | Admitting: Obstetrics & Gynecology

## 2023-11-27 VITALS — BP 125/81 | HR 92 | Wt 206.0 lb

## 2023-11-27 DIAGNOSIS — Z3480 Encounter for supervision of other normal pregnancy, unspecified trimester: Secondary | ICD-10-CM

## 2023-11-27 DIAGNOSIS — O99210 Obesity complicating pregnancy, unspecified trimester: Secondary | ICD-10-CM

## 2023-11-27 DIAGNOSIS — D509 Iron deficiency anemia, unspecified: Secondary | ICD-10-CM

## 2023-11-27 NOTE — Progress Notes (Signed)
Denies problems today.

## 2023-11-27 NOTE — Progress Notes (Signed)
   PRENATAL VISIT NOTE  Subjective:  Mary Mora is a 33 y.o. [phone] at [redacted]w[redacted]d being seen today for ongoing prenatal care.  She is currently monitored for the following issues for this low-risk pregnancy and has Achalasia of cardia; Supervision of other normal pregnancy, antepartum; Obesity affecting pregnancy; and Anemia on their problem list.  Patient reports no complaints.  Contractions: Not present. Vag. Bleeding: None.  Movement: Present. Denies leaking of fluid.   The following portions of the patient's history were reviewed and updated as appropriate: allergies, current medications, past family history, past medical history, past social history, past surgical history and problem list.   Objective:   Vitals:   11/27/23 1120  BP: 125/81  Pulse: 92  Weight: 206 lb (93.4 kg)    Fetal Status:     Movement: Present    FHR- 130s FH-30  General:  Alert, oriented and cooperative. Patient is in no acute distress.  Skin: Skin is warm and dry. No rash noted.   Cardiovascular: Normal heart rate noted  Respiratory: Normal respiratory effort, no problems with respiration noted  Abdomen: Soft, gravid, appropriate for gestational age.  Pain/Pressure: Absent     Pelvic: Cervical exam deferred        Extremities: Normal range of motion.  Edema: None  Mental Status: Normal mood and affect. Normal behavior. Normal judgment and thought content.   Assessment and Plan:  Pregnancy: [id number] at [redacted]w[redacted]d  With regard to her anemia, she has had 7 IV iron infusions, her HBG increased from 9.0 to l.2. We discussed her diet and how she can increase iron in her diet.   Preterm labor symptoms and general obstetric precautions including but not limited to vaginal bleeding, contractions, leaking of fluid and fetal movement were reviewed in detail with the patient. Please refer to After Visit Summary for other counseling recommendations.   No follow-ups on file.  Future Appointments  Date Time  Provider Department Center  12/11/2023 10:55 AM Leftwich-Kirby, Wilmer Floor, CNM [facility] None    Allie Bossier, MD

## 2023-12-11 ENCOUNTER — Encounter: Payer: Self-pay | Admitting: Advanced Practice Midwife

## 2023-12-11 ENCOUNTER — Ambulatory Visit (INDEPENDENT_AMBULATORY_CARE_PROVIDER_SITE_OTHER): Payer: BC Managed Care – PPO | Admitting: Advanced Practice Midwife

## 2023-12-11 VITALS — BP 123/71 | HR 88 | Wt 201.2 lb

## 2023-12-11 DIAGNOSIS — R109 Unspecified abdominal pain: Secondary | ICD-10-CM

## 2023-12-11 DIAGNOSIS — Z3A33 33 weeks gestation of pregnancy: Secondary | ICD-10-CM

## 2023-12-11 DIAGNOSIS — D509 Iron deficiency anemia, unspecified: Secondary | ICD-10-CM | POA: Diagnosis not present

## 2023-12-11 DIAGNOSIS — Z3480 Encounter for supervision of other normal pregnancy, unspecified trimester: Secondary | ICD-10-CM

## 2023-12-11 DIAGNOSIS — O26893 Other specified pregnancy related conditions, third trimester: Secondary | ICD-10-CM | POA: Diagnosis not present

## 2023-12-11 MED ORDER — ACCRUFER 30 MG PO CAPS: 1.0000 | ORAL_CAPSULE | Freq: Two times a day (BID) | ORAL | 3 refills | Status: AC

## 2023-12-11 NOTE — Progress Notes (Signed)
 Pt presents for ROB visit. No concerns

## 2023-12-11 NOTE — Progress Notes (Signed)
   PRENATAL VISIT NOTE  Subjective:  Mary Mora is a 33 y.o. [phone] at [redacted]w[redacted]d being seen today for ongoing prenatal care.  She is currently monitored for the following issues for this low-risk pregnancy and has Achalasia of cardia; Supervision of other normal pregnancy, antepartum; Obesity affecting pregnancy; and Anemia on their problem list.  Patient reports occasional contractions.  Contractions: Not present. Vag. Bleeding: None.  Movement: Present. Denies leaking of fluid.   The following portions of the patient's history were reviewed and updated as appropriate: allergies, current medications, past family history, past medical history, past social history, past surgical history and problem list.   Objective:   Vitals:   12/11/23 1108  BP: 123/71  Pulse: 88  Weight: 201 lb 3.2 oz (91.3 kg)    Fetal Status: Fetal Heart Rate (bpm): 144 Fundal Height: 33 cm Movement: Present     General:  Alert, oriented and cooperative. Patient is in no acute distress.  Skin: Skin is warm and dry. No rash noted.   Cardiovascular: Normal heart rate noted  Respiratory: Normal respiratory effort, no problems with respiration noted  Abdomen: Soft, gravid, appropriate for gestational age.  Pain/Pressure: Present     Pelvic: Cervical exam deferred        Extremities: Normal range of motion.  Edema: None  Mental Status: Normal mood and affect. Normal behavior. Normal judgment and thought content.   Assessment and Plan:  Pregnancy: [id number] at [redacted]w[redacted]d 1. Supervision of other normal pregnancy, antepartum (Primary) --Anticipatory guidance about next visits/weeks of pregnancy given.   2. Iron deficiency anemia, unspecified iron deficiency anemia type --Pt had IV iron infusion in January, Hgb 9.2 on 12/20.  Recheck at next visit.  --Pt initiated ferrous sulfate but reports GI upset/constipation so is unable to tolerate oral iron --Will try Accrufer for better absorption with less GI side effects  -  Ferric Maltol (ACCRUFER) 30 MG CAPS; Take 1 capsule (30 mg total) by mouth 2 (two) times daily at 8 am and 10 pm.  Dispense: 60 capsule; Refill: 3  3. [redacted] weeks gestation of pregnancy   4. Abdominal pain during pregnancy in third trimester --Cramping occasionally at night --Rest/ice/heat/warm bath/increase PO fluids/Tylenol/pregnancy support belt    Preterm labor symptoms and general obstetric precautions including but not limited to vaginal bleeding, contractions, leaking of fluid and fetal movement were reviewed in detail with the patient. Please refer to After Visit Summary for other counseling recommendations.   Return in about 2 weeks (around 12/25/2023) for LOB.  Future Appointments  Date Time Provider Department Center  12/25/2023 10:55 AM Allie Bossier, MD [facility] None  01/01/2024 10:35 AM Sue Lush, FNP [facility] None  01/08/2024 10:35 AM Leftwich-Kirby, Wilmer Floor, CNM [facility] None  01/15/2024 10:35 AM Sue Lush, FNP [facility] None  01/22/2024 10:35 AM Constant, Gigi Gin, MD [facility] None    Sharen Counter, CNM

## 2023-12-25 ENCOUNTER — Ambulatory Visit (INDEPENDENT_AMBULATORY_CARE_PROVIDER_SITE_OTHER): Payer: BC Managed Care – PPO | Admitting: Obstetrics & Gynecology

## 2023-12-25 ENCOUNTER — Other Ambulatory Visit (HOSPITAL_COMMUNITY)
Admission: RE | Admit: 2023-12-25 | Discharge: 2023-12-25 | Disposition: A | Discharge status: Home / Self Care | Source: Ambulatory Visit | Attending: Obstetrics & Gynecology | Admitting: Obstetrics & Gynecology

## 2023-12-25 VITALS — BP 120/80 | HR 85 | Wt 203.0 lb

## 2023-12-25 DIAGNOSIS — O99210 Obesity complicating pregnancy, unspecified trimester: Secondary | ICD-10-CM

## 2023-12-25 DIAGNOSIS — Z3480 Encounter for supervision of other normal pregnancy, unspecified trimester: Secondary | ICD-10-CM

## 2023-12-25 DIAGNOSIS — Z3493 Encounter for supervision of normal pregnancy, unspecified, third trimester: Secondary | ICD-10-CM | POA: Diagnosis not present

## 2023-12-25 DIAGNOSIS — Z3A36 36 weeks gestation of pregnancy: Secondary | ICD-10-CM

## 2023-12-25 DIAGNOSIS — O99019 Anemia complicating pregnancy, unspecified trimester: Secondary | ICD-10-CM

## 2023-12-25 NOTE — Progress Notes (Signed)
   PRENATAL VISIT NOTE  Subjective:  Mary Mora is a 33 y.o. [phone] at [redacted]w[redacted]d being seen today for ongoing prenatal care.  She is currently monitored for the following issues for this low-risk pregnancy and has Achalasia of cardia; Supervision of other normal pregnancy, antepartum; Obesity affecting pregnancy; and Anemia during pregnancy on their problem list.  Patient reports no complaints.  Contractions: Not present. Vag. Bleeding: None.  Movement: Present. Denies leaking of fluid.   The following portions of the patient's history were reviewed and updated as appropriate: allergies, current medications, past family history, past medical history, past social history, past surgical history and problem list.   Objective:   Vitals:   12/25/23 1059  BP: 120/80  Pulse: 85  Weight: 203 lb (92.1 kg)    Fetal Status: Fetal Heart Rate (bpm): 134 Fundal Height: 36 cm Movement: Present  Presentation: Vertex  General:  Alert, oriented and cooperative. Patient is in no acute distress.  Skin: Skin is warm and dry. No rash noted.   Cardiovascular: Normal heart rate noted  Respiratory: Normal respiratory effort, no problems with respiration noted  Abdomen: Soft, gravid, appropriate for gestational age.  Pain/Pressure: Present     Pelvic: Cervical exam performed in the presence of a chaperone Dilation: 1 Effacement (%): 0 Station: -3  Extremities: Normal range of motion.     Mental Status: Normal mood and affect. Normal behavior. Normal judgment and thought content.   Assessment and Plan:  Pregnancy: [id number] at [redacted]w[redacted]d 1. [redacted] weeks gestation of pregnancy (Primary)  - Culture, beta strep (group b only) - Cervicovaginal ancillary only( [place])  2. Obesity affecting pregnancy, antepartum, unspecified obesity type - excellent weight gain  3. Supervision of other normal pregnancy, antepartum   4. Anemia during pregnancy  - CBC  Preterm labor symptoms and general obstetric  precautions including but not limited to vaginal bleeding, contractions, leaking of fluid and fetal movement were reviewed in detail with the patient. Please refer to After Visit Summary for other counseling recommendations.   Return in about 1 week (around 01/01/2024).  Future Appointments  Date Time Provider Department Center  01/01/2024 10:35 AM Sue Lush, FNP [facility] None  01/08/2024 10:35 AM Leftwich-Kirby, Wilmer Floor, CNM [facility] None  01/15/2024 10:35 AM Sue Lush, FNP [facility] None  01/22/2024 10:35 AM Constant, Gigi Gin, MD [facility] None    Allie Bossier, MD

## 2023-12-26 LAB — CERVICOVAGINAL ANCILLARY ONLY
Chlamydia: NEGATIVE
Comment: NEGATIVE
Comment: NORMAL
Neisseria Gonorrhea: NEGATIVE

## 2023-12-26 LAB — CBC
Hematocrit: 35.7 % (ref 34.0–46.6)
Hemoglobin: 11.7 g/dL (ref 11.1–15.9)
MCH: 26.7 pg (ref 26.6–33.0)
MCHC: 32.8 g/dL (ref 31.5–35.7)
MCV: 81 fL (ref 79–97)
Platelets: 281 10*3/uL (ref 150–450)
RBC: 4.39 x10E6/uL (ref 3.77–5.28)
RDW: 18.9 % — ABNORMAL HIGH (ref 11.7–15.4)
WBC: 8.5 10*3/uL (ref 3.4–10.8)

## 2023-12-29 LAB — CULTURE, BETA STREP (GROUP B ONLY): Strep Gp B Culture: NEGATIVE

## 2024-01-01 ENCOUNTER — Encounter: Payer: BC Managed Care – PPO | Admitting: Obstetrics and Gynecology

## 2024-01-01 NOTE — Progress Notes (Deleted)
   PRENATAL VISIT NOTE  Subjective:  Mary Mora is a 33 y.o. [phone] at [redacted]w[redacted]d being seen today for ongoing prenatal care.  She is currently monitored for the following issues for this low-risk pregnancy and has Achalasia of cardia; Supervision of other normal pregnancy, antepartum; Obesity affecting pregnancy; and Anemia during pregnancy on their problem list.  Patient reports {sx:14538}.   .  .   . Denies leaking of fluid.   The following portions of the patient's history were reviewed and updated as appropriate: allergies, current medications, past family history, past medical history, past social history, past surgical history and problem list.   Objective:  There were no vitals filed for this visit.  Fetal Status:           General:  Alert, oriented and cooperative. Patient is in no acute distress.  Skin: Skin is warm and dry. No rash noted.   Cardiovascular: Normal heart rate noted  Respiratory: Normal respiratory effort, no problems with respiration noted  Abdomen: Soft, gravid, appropriate for gestational age.        Pelvic: {Blank single:19197::"Cervical exam performed in the presence of a chaperone","Cervical exam deferred"}        Extremities: Normal range of motion.     Mental Status: Normal mood and affect. Normal behavior. Normal judgment and thought content.   Assessment and Plan:  Pregnancy: [id number] at [redacted]w[redacted]d 1. Supervision of other normal pregnancy, antepartum (Primary)   2. Anemia during pregnancy S/p iv iron, hgb last visit 11.7   3. Obesity affecting pregnancy, antepartum, unspecified obesity type   4. [redacted] weeks gestation of pregnancy Swabs collected last visit   Preterm labor symptoms and general obstetric precautions including but not limited to vaginal bleeding, contractions, leaking of fluid and fetal movement were reviewed in detail with the patient. Please refer to After Visit Summary for other counseling recommendations.   No follow-ups on  file.  Future Appointments  Date Time Provider Department Center  01/01/2024 10:35 AM Sue Lush, FNP [facility] None  01/08/2024 10:35 AM Leftwich-Kirby, Wilmer Floor, CNM [facility] None  01/15/2024 10:35 AM Sue Lush, FNP [facility] None  01/22/2024 10:35 AM Constant, Gigi Gin, MD [facility] None    Albertine Grates, FNP

## 2024-01-08 ENCOUNTER — Encounter: Payer: Self-pay | Admitting: Advanced Practice Midwife

## 2024-01-08 ENCOUNTER — Ambulatory Visit (INDEPENDENT_AMBULATORY_CARE_PROVIDER_SITE_OTHER): Payer: BC Managed Care – PPO | Admitting: Advanced Practice Midwife

## 2024-01-08 VITALS — BP 126/74 | HR 88 | Wt 203.8 lb

## 2024-01-08 DIAGNOSIS — R110 Nausea: Secondary | ICD-10-CM

## 2024-01-08 DIAGNOSIS — O368130 Decreased fetal movements, third trimester, not applicable or unspecified: Secondary | ICD-10-CM

## 2024-01-08 DIAGNOSIS — M5431 Sciatica, right side: Secondary | ICD-10-CM | POA: Diagnosis not present

## 2024-01-08 DIAGNOSIS — Z3A37 37 weeks gestation of pregnancy: Secondary | ICD-10-CM | POA: Diagnosis not present

## 2024-01-08 DIAGNOSIS — Z3480 Encounter for supervision of other normal pregnancy, unspecified trimester: Secondary | ICD-10-CM

## 2024-01-08 MED ORDER — ONDANSETRON HCL 4 MG PO TABS: 4.0000 mg | ORAL_TABLET | Freq: Three times a day (TID) | ORAL | 0 refills | Status: DC | PRN

## 2024-01-08 NOTE — Progress Notes (Signed)
 PRENATAL VISIT NOTE  Subjective:  Mary Mora is a 33 y.o. [phone] at [redacted]w[redacted]d being seen today for ongoing prenatal care. She reports nausea on many day recently. She also reports decreased fetal movement for 2 days. Patient states that she has very busy because it was her baby shower weekend. She also reports that on most mornings she wakes up with an electricity like shooting pain down her right leg.  She is currently monitored for the following issues for this low-risk pregnancy and has Achalasia of cardia; Supervision of other normal pregnancy, antepartum; Obesity affecting pregnancy; and Anemia during pregnancy on their problem list.  Patient reports nausea.  Contractions: Not present. Vag. Bleeding: None.  Movement: (!) Decreased. Denies leaking of fluid.   The following portions of the patient's history were reviewed and updated as appropriate: allergies, current medications, past family history, past medical history, past social history, past surgical history and problem list.   Objective:   Vitals:   01/08/24 1116  BP: 126/74  Pulse: 88  Weight: 92.4 kg    Fetal Status: Fetal Heart Rate (bpm): 135   Movement: (!) Decreased     General:  Alert, oriented and cooperative. Patient is in no acute distress.  Skin: Skin is warm and dry. No rash noted.   Cardiovascular: Normal heart rate noted  Respiratory: Normal respiratory effort, no problems with respiration noted  Abdomen: Soft, gravid, appropriate for gestational age.  Pain/Pressure: Present     Pelvic: Cervical exam deferred        Extremities: Normal range of motion.  Edema: None  Mental Status: Normal mood and affect. Normal behavior. Normal judgment and thought content.   Assessment and Plan:  1. Supervision of other normal pregnancy, antepartum (Primary)   2. [redacted] weeks gestation of pregnancy   3. Decreased fetal movements in third trimester, single or unspecified fetus NST reactive  Patient states she is  feeling movement now   4. Sciatica, right side   5. Nausea - ondansetron (ZOFRAN) 4 MG tablet; Take 1 tablet (4 mg total) by mouth every 8 (eight) hours as needed for nausea or vomiting.  Dispense: 20 tablet; Refill: 0      Term labor symptoms and general obstetric precautions including but not limited to vaginal bleeding, contractions, leaking of fluid and fetal movement were reviewed in detail with the patient. Please refer to After Visit Summary for other counseling recommendations.     Future Appointments  Date Time Provider Department Center  01/15/2024 10:35 AM Sue Lush, FNP [facility] None  01/22/2024 10:35 AM Constant, Gigi Gin, MD [facility] None    Zenia Resides, Student-NP  CNM attestation:  I have seen and examined this patient; I agree with above documentation in the NP student's note.   Mary Mora is a 33 y.o. [phone] in the [facility] office for routine prenatal visit for low risk pregnancy. See problem list below. +FM, denies LOF, VB, contractions, vaginal discharge.  Patient Active Problem List   Diagnosis Date Noted   Anemia during pregnancy 09/15/2023   Obesity affecting pregnancy 08/15/2023   Supervision of other normal pregnancy, antepartum 06/22/2023   Achalasia of cardia 09/01/2017     ROS, labs, PMH reviewed  PE: BP 126/74   Pulse 88   Wt 203 lb 12.8 oz (92.4 kg)   LMP 04/18/2023 (Exact Date)   BMI 37.28 kg/m  Gen: calm comfortable, well appearing Resp: normal effort, no distress Abd: gravid appropriate for gestational age  Fundal  height: 37 cm FHT by doppler: 135 NST reactive  Plan: - fetal kick counts reinforced, term labor precautions -    1. Supervision of other normal pregnancy, antepartum (Primary)  2. [redacted] weeks gestation of pregnancy   3. Decreased fetal movements in third trimester, single or unspecified fetus --Prior to arrival in office --Normal fetal movement in office and reactive NST --Kick counts  reviewed  4. Sciatica, right side --Rest/ice/heat/warm bath/increase PO fluids/Tylenol/pregnancy support belt   5. Nausea  - ondansetron (ZOFRAN) 4 MG tablet; Take 1 tablet (4 mg total) by mouth every 8 (eight) hours as needed for nausea or vomiting.  Dispense: 20 tablet; Refill: 0    Sharen Counter, CNM 6:37 PM

## 2024-01-08 NOTE — Progress Notes (Signed)
 Pt presents for ROB visit. No concerns

## 2024-01-15 ENCOUNTER — Ambulatory Visit (INDEPENDENT_AMBULATORY_CARE_PROVIDER_SITE_OTHER): Payer: BC Managed Care – PPO | Admitting: Obstetrics and Gynecology

## 2024-01-15 VITALS — BP 126/86 | HR 96 | Wt 207.0 lb

## 2024-01-15 DIAGNOSIS — Z3A38 38 weeks gestation of pregnancy: Secondary | ICD-10-CM | POA: Diagnosis not present

## 2024-01-15 DIAGNOSIS — Z3480 Encounter for supervision of other normal pregnancy, unspecified trimester: Secondary | ICD-10-CM

## 2024-01-15 NOTE — Progress Notes (Signed)
   PRENATAL VISIT NOTE  Subjective:  Mary Mora is a 33 y.o. [phone] at [redacted]w[redacted]d being seen today for ongoing prenatal care.  She is currently monitored for the following issues for this low-risk pregnancy and has Achalasia of cardia; Supervision of other normal pregnancy, antepartum; Obesity affecting pregnancy; and Anemia during pregnancy on their problem list.  Patient reports  no complaints .  Contractions: Irregular. Vag. Bleeding: None.  Movement: Present. Denies leaking of fluid.   The following portions of the patient's history were reviewed and updated as appropriate: allergies, current medications, past family history, past medical history, past social history, past surgical history and problem list.   Objective:   Vitals:   01/15/24 1035  BP: 126/86  Pulse: 96  Weight: 207 lb (93.9 kg)    Fetal Status: Fetal Heart Rate (bpm): 148 Fundal Height: 39 cm Movement: Present  Presentation: Vertex  General:  Alert, oriented and cooperative. Patient is in no acute distress.  Skin: Skin is warm and dry. No rash noted.   Cardiovascular: Normal heart rate noted  Respiratory: Normal respiratory effort, no problems with respiration noted  Abdomen: Soft, gravid, appropriate for gestational age.  Pain/Pressure: Present     Pelvic: Cervical exam performed in the presence of a chaperone Dilation: 1 Effacement (%): Thick    Extremities: Normal range of motion.  Edema: None  Mental Status: Normal mood and affect. Normal behavior. Normal judgment and thought content.   Assessment and Plan:  Pregnancy: [id number] at [redacted]w[redacted]d 1. Supervision of other normal pregnancy, antepartum (Primary) BP and FHR normal Doing well, feeling regular movement  FH appropriate   2. [redacted] weeks gestation of pregnancy Labor precautions Planning BTL, consent at hospital   Term labor symptoms and general obstetric precautions including but not limited to vaginal bleeding, contractions, leaking of fluid and fetal  movement were reviewed in detail with the patient. Please refer to After Visit Summary for other counseling recommendations.   Return in one week for routine prenatal   Future Appointments  Date Time Provider Department Center  01/22/2024 10:35 AM Constant, Gigi Gin, MD [facility] None    Albertine Grates, FNP

## 2024-01-15 NOTE — Progress Notes (Signed)
 No c/o today

## 2024-01-22 ENCOUNTER — Ambulatory Visit (INDEPENDENT_AMBULATORY_CARE_PROVIDER_SITE_OTHER): Payer: BC Managed Care – PPO | Admitting: Obstetrics and Gynecology

## 2024-01-22 ENCOUNTER — Telehealth (HOSPITAL_COMMUNITY): Payer: Self-pay | Admitting: *Deleted

## 2024-01-22 ENCOUNTER — Encounter: Payer: Self-pay | Admitting: Obstetrics and Gynecology

## 2024-01-22 ENCOUNTER — Encounter (HOSPITAL_COMMUNITY): Payer: Self-pay | Admitting: *Deleted

## 2024-01-22 VITALS — BP 114/77 | HR 88 | Wt 202.0 lb

## 2024-01-22 DIAGNOSIS — Z3480 Encounter for supervision of other normal pregnancy, unspecified trimester: Secondary | ICD-10-CM | POA: Diagnosis not present

## 2024-01-22 DIAGNOSIS — O99019 Anemia complicating pregnancy, unspecified trimester: Secondary | ICD-10-CM | POA: Diagnosis not present

## 2024-01-22 DIAGNOSIS — O99210 Obesity complicating pregnancy, unspecified trimester: Secondary | ICD-10-CM | POA: Diagnosis not present

## 2024-01-22 NOTE — Progress Notes (Signed)
 ROB, reports no concerns today.

## 2024-01-22 NOTE — Progress Notes (Signed)
   PRENATAL VISIT NOTE  Subjective:  Mary Mora is a 33 y.o. [phone] at [redacted]w[redacted]d being seen today for ongoing prenatal care.  She is currently monitored for the following issues for this low-risk pregnancy and has Achalasia of cardia; Supervision of other normal pregnancy, antepartum; Obesity affecting pregnancy; and Anemia during pregnancy on their problem list.  Patient reports no complaints.  Contractions: Irritability. Vag. Bleeding: None.  Movement: Present. Denies leaking of fluid.   The following portions of the patient's history were reviewed and updated as appropriate: allergies, current medications, past family history, past medical history, past social history, past surgical history and problem list.   Objective:   Vitals:   01/22/24 1040  BP: 114/77  Pulse: 88  Weight: 202 lb (91.6 kg)    Fetal Status: Fetal Heart Rate (bpm): 139 Fundal Height: 40 cm Movement: Present     General:  Alert, oriented and cooperative. Patient is in no acute distress.  Skin: Skin is warm and dry. No rash noted.   Cardiovascular: Normal heart rate noted  Respiratory: Normal respiratory effort, no problems with respiration noted  Abdomen: Soft, gravid, appropriate for gestational age.  Pain/Pressure: Present     Pelvic: Cervical exam performed in the presence of a chaperone Dilation: 3 Effacement (%): 30 Station: -3  Extremities: Normal range of motion.  Edema: None  Mental Status: Normal mood and affect. Normal behavior. Normal judgment and thought content.   Assessment and Plan:  Pregnancy: [id number] at [redacted]w[redacted]d 1. Supervision of other normal pregnancy, antepartum (Primary) Patient is doing well without complaints Post date induction requested at 41 weeks NST with BPP on 01/26/24 - [place] MFM FETAL BPP W/NONSTRESS; Future  2. Obesity affecting pregnancy, antepartum, unspecified obesity type   3. Anemia during pregnancy Continue iron supplement  Term labor symptoms and general obstetric  precautions including but not limited to vaginal bleeding, contractions, leaking of fluid and fetal movement were reviewed in detail with the patient. Please refer to After Visit Summary for other counseling recommendations.   No follow-ups on file.  No future appointments.  Catalina Antigua, MD

## 2024-01-22 NOTE — Telephone Encounter (Signed)
 Preadmission screen

## 2024-01-23 DIAGNOSIS — M5489 Other dorsalgia: Secondary | ICD-10-CM | POA: Diagnosis not present

## 2024-01-25 ENCOUNTER — Inpatient Hospital Stay (HOSPITAL_COMMUNITY): Admitting: Anesthesiology

## 2024-01-25 ENCOUNTER — Other Ambulatory Visit: Payer: Self-pay

## 2024-01-25 ENCOUNTER — Other Ambulatory Visit (INDEPENDENT_AMBULATORY_CARE_PROVIDER_SITE_OTHER)

## 2024-01-25 ENCOUNTER — Inpatient Hospital Stay (HOSPITAL_COMMUNITY)
Admission: AD | Admit: 2024-01-25 | Discharge: 2024-01-27 | DRG: 807 | Disposition: A | Discharge status: Home / Self Care | Attending: Obstetrics and Gynecology | Admitting: Obstetrics and Gynecology

## 2024-01-25 ENCOUNTER — Encounter (HOSPITAL_COMMUNITY): Payer: Self-pay | Admitting: Obstetrics and Gynecology

## 2024-01-25 DIAGNOSIS — O9962 Diseases of the digestive system complicating childbirth: Secondary | ICD-10-CM | POA: Diagnosis not present

## 2024-01-25 DIAGNOSIS — O99214 Obesity complicating childbirth: Secondary | ICD-10-CM | POA: Diagnosis present

## 2024-01-25 DIAGNOSIS — R030 Elevated blood-pressure reading, without diagnosis of hypertension: Secondary | ICD-10-CM | POA: Diagnosis not present

## 2024-01-25 DIAGNOSIS — Z8249 Family history of ischemic heart disease and other diseases of the circulatory system: Secondary | ICD-10-CM

## 2024-01-25 DIAGNOSIS — Z3480 Encounter for supervision of other normal pregnancy, unspecified trimester: Principal | ICD-10-CM

## 2024-01-25 DIAGNOSIS — O99019 Anemia complicating pregnancy, unspecified trimester: Secondary | ICD-10-CM

## 2024-01-25 DIAGNOSIS — Z3A40 40 weeks gestation of pregnancy: Secondary | ICD-10-CM

## 2024-01-25 DIAGNOSIS — Z23 Encounter for immunization: Secondary | ICD-10-CM | POA: Diagnosis not present

## 2024-01-25 DIAGNOSIS — O9902 Anemia complicating childbirth: Principal | ICD-10-CM | POA: Diagnosis present

## 2024-01-25 DIAGNOSIS — O99892 Other specified diseases and conditions complicating childbirth: Secondary | ICD-10-CM | POA: Diagnosis not present

## 2024-01-25 DIAGNOSIS — K219 Gastro-esophageal reflux disease without esophagitis: Secondary | ICD-10-CM | POA: Diagnosis present

## 2024-01-25 DIAGNOSIS — O480 Post-term pregnancy: Secondary | ICD-10-CM

## 2024-01-25 DIAGNOSIS — O26893 Other specified pregnancy related conditions, third trimester: Secondary | ICD-10-CM | POA: Diagnosis not present

## 2024-01-25 DIAGNOSIS — Z833 Family history of diabetes mellitus: Secondary | ICD-10-CM | POA: Diagnosis not present

## 2024-01-25 DIAGNOSIS — O4202 Full-term premature rupture of membranes, onset of labor within 24 hours of rupture: Secondary | ICD-10-CM | POA: Diagnosis not present

## 2024-01-25 LAB — PROTEIN / CREATININE RATIO, URINE
Creatinine, Urine: 18 mg/dL
Total Protein, Urine: <6 mg/dL

## 2024-01-25 LAB — COMPREHENSIVE METABOLIC PANEL WITH GFR
ALT: 10 U/L (ref 0–44)
AST: 16 U/L (ref 15–41)
Albumin: 2.5 g/dL — ABNORMAL LOW (ref 3.5–5.0)
Alkaline Phosphatase: 162 U/L — ABNORMAL HIGH (ref 38–126)
Anion gap: 10 (ref 5–15)
BUN: <5 mg/dL — ABNORMAL LOW (ref 6–20)
CO2: 20 mmol/L — ABNORMAL LOW (ref 22–32)
Calcium: 8.6 mg/dL — ABNORMAL LOW (ref 8.9–10.3)
Chloride: 106 mmol/L (ref 98–111)
Creatinine, Ser: 0.59 mg/dL (ref 0.44–1.00)
GFR, Estimated: >60 mL/min (ref >60)
Glucose, Bld: 87 mg/dL (ref 70–99)
Potassium: 3.7 mmol/L (ref 3.5–5.1)
Sodium: 136 mmol/L (ref 135–145)
Total Bilirubin: 0.5 mg/dL (ref 0.0–1.2)
Total Protein: 6.1 g/dL — ABNORMAL LOW (ref 6.5–8.1)

## 2024-01-25 LAB — URINALYSIS, ROUTINE W REFLEX MICROSCOPIC
Bilirubin Urine: NEGATIVE
Glucose, UA: NEGATIVE mg/dL
Ketones, ur: NEGATIVE mg/dL
Leukocytes,Ua: NEGATIVE
Nitrite: NEGATIVE
Protein, ur: NEGATIVE mg/dL
Specific Gravity, Urine: <1.005 — ABNORMAL LOW (ref 1.005–1.030)
pH: 7 (ref 5.0–8.0)

## 2024-01-25 LAB — CBC
HCT: 33.3 % — ABNORMAL LOW (ref 36.0–46.0)
Hemoglobin: 11.2 g/dL — ABNORMAL LOW (ref 12.0–15.0)
MCH: 26.9 pg (ref 26.0–34.0)
MCHC: 33.6 g/dL (ref 30.0–36.0)
MCV: 79.9 fL — ABNORMAL LOW (ref 80.0–100.0)
Platelets: 262 10*3/uL (ref 150–400)
RBC: 4.17 MIL/uL (ref 3.87–5.11)
RDW: 14.1 % (ref 11.5–15.5)
WBC: 8.7 10*3/uL (ref 4.0–10.5)
nRBC: 0 % (ref 0.0–0.2)

## 2024-01-25 LAB — URINALYSIS, MICROSCOPIC (REFLEX)

## 2024-01-25 LAB — TYPE AND SCREEN
ABO/RH(D): O POS
Antibody Screen: NEGATIVE

## 2024-01-25 MED ORDER — OXYCODONE-ACETAMINOPHEN 5-325 MG PO TABS: 2.0000 | ORAL_TABLET | ORAL | Status: DC | PRN

## 2024-01-25 MED ORDER — EPHEDRINE 5 MG/ML INJ: 10.0000 mg | INTRAVENOUS | Status: DC | PRN

## 2024-01-25 MED ORDER — ONDANSETRON HCL 4 MG/2ML IJ SOLN: 4.0000 mg | Freq: Four times a day (QID) | INTRAMUSCULAR | Status: DC | PRN

## 2024-01-25 MED ORDER — FENTANYL CITRATE (PF) 100 MCG/2ML IJ SOLN
100.0000 ug | INTRAMUSCULAR | Status: DC | PRN
  Administered 2024-01-25: 100 ug via INTRAVENOUS
  Filled 2024-01-25: qty 2

## 2024-01-25 MED ORDER — DIPHENHYDRAMINE HCL 50 MG/ML IJ SOLN: 12.5000 mg | INTRAMUSCULAR | Status: DC | PRN

## 2024-01-25 MED ORDER — LACTATED RINGERS IV SOLN: 500.0000 mL | INTRAVENOUS | Status: DC | PRN

## 2024-01-25 MED ORDER — PHENYLEPHRINE 80 MCG/ML (10ML) SYRINGE FOR IV PUSH (FOR BLOOD PRESSURE SUPPORT)
80.0000 ug | PREFILLED_SYRINGE | INTRAVENOUS | Status: DC | PRN
  Administered 2024-01-25: 80 ug via INTRAVENOUS

## 2024-01-25 MED ORDER — LIDOCAINE HCL (PF) 1 % IJ SOLN
INTRAMUSCULAR | Status: DC | PRN
  Administered 2024-01-25: 11 mL via EPIDURAL

## 2024-01-25 MED ORDER — OXYTOCIN BOLUS FROM INFUSION
333.0000 mL | Freq: Once | INTRAVENOUS | Status: AC
  Administered 2024-01-25: 333 mL via INTRAVENOUS

## 2024-01-25 MED ORDER — LACTATED RINGERS IV SOLN: 500.0000 mL | Freq: Once | INTRAVENOUS | Status: DC

## 2024-01-25 MED ORDER — ACETAMINOPHEN 325 MG PO TABS: 650.0000 mg | ORAL_TABLET | ORAL | Status: DC | PRN

## 2024-01-25 MED ORDER — PHENYLEPHRINE 80 MCG/ML (10ML) SYRINGE FOR IV PUSH (FOR BLOOD PRESSURE SUPPORT)
80.0000 ug | PREFILLED_SYRINGE | INTRAVENOUS | Status: DC | PRN
  Filled 2024-01-25: qty 10

## 2024-01-25 MED ORDER — LIDOCAINE HCL (PF) 1 % IJ SOLN
30.0000 mL | INTRAMUSCULAR | Status: DC | PRN
Start: 2024-01-25 — End: 2024-01-26

## 2024-01-25 MED ORDER — SOD CITRATE-CITRIC ACID 500-334 MG/5ML PO SOLN: 30.0000 mL | ORAL | Status: DC | PRN

## 2024-01-25 MED ORDER — OXYTOCIN-SODIUM CHLORIDE 30-0.9 UT/500ML-% IV SOLN
2.5000 [IU]/h | INTRAVENOUS | Status: DC
  Administered 2024-01-26: 2.5 [IU]/h via INTRAVENOUS
  Filled 2024-01-25: qty 500

## 2024-01-25 MED ORDER — FENTANYL-BUPIVACAINE-NACL 0.5-0.125-0.9 MG/250ML-% EP SOLN
12.0000 mL/h | EPIDURAL | Status: DC | PRN
  Administered 2024-01-25: 12 mL/h via EPIDURAL
  Filled 2024-01-25: qty 250

## 2024-01-25 MED ORDER — OXYCODONE-ACETAMINOPHEN 5-325 MG PO TABS: 1.0000 | ORAL_TABLET | ORAL | Status: DC | PRN

## 2024-01-25 NOTE — MAU Note (Signed)
..  Mary Mora is a 33 y.o. at [redacted]w[redacted]d here in MAU reporting: contraction q10 min that started this morning around 0300. Denies leaking of fluid. Endorses +FM. Reports scant bloody mucous discharge.   Pain score: 7/10 Vitals:   01/25/24 1620  BP: (!) 140/89  Pulse: 91  Resp: 20  Temp: 98 F (36.7 C)  SpO2: 100%     FHT:140 Lab orders placed from triage:  UA

## 2024-01-25 NOTE — Discharge Summary (Signed)
 Postpartum Discharge Summary  Date of Service updated - 01/27/24      Patient Name: Mary Mora DOB: 02-Oct-1991 MRN: [id number]  Date of admission: 01/25/2024 Delivery date:01/25/2024 Delivering provider: Jacklyn Shell Date of discharge: 01/27/2024  Admitting diagnosis: Encounter for induction of labor [Z34.90] Intrauterine pregnancy: [redacted]w[redacted]d     Secondary diagnosis:  Principal Problem:   Normal labor Active Problems:   Vaginal delivery Elevated BP, normotensive on day of d/c  Additional problems: anemia of pregnancy    Discharge diagnosis: Term Pregnancy Delivered and Anemia                                              Post partum procedures: None Augmentation: none Complications: None  Hospital course: Onset of Labor With Vaginal Delivery      33 y.o. yo [id number] at [redacted]w[redacted]d was admitted in Active Labor on 01/25/2024. Labor course was complicated by nothing  Membrane Rupture Time/Date:  ,   Delivery Method:Vaginal, Spontaneous Operative Delivery:N/A Episiotomy: None Lacerations:  None Patient had a postpartum course complicated by scattered elevated BP (two total throughout admit), PEC w/up negative and was normotensive on day of discharge, plan for 1 week BP f/up.  She is ambulating, tolerating a regular diet, passing flatus, and urinating well. Patient is discharged home in stable condition on 01/27/24.  Newborn Data: Birth date:01/25/2024 Birth time:11:40 PM Gender:Female Living status:Living Apgars:7 ,9  Weight:3520 g7# 12 oz  Magnesium Sulfate received: No BMZ received: No Rhophylac:N/A MMR:N/A T-DaP: Declined postpartum Flu: No RSV Vaccine received: No Transfusion:No  Immunizations received: Immunization History  Administered Date(s) Administered   Influenza,inj,Quad PF,6+ Mos 08/07/2014    Physical exam  Vitals:   01/26/24 1520 01/26/24 2056 01/26/24 2106 01/27/24 0613  BP: 126/86 (!) 141/82 117/78 119/79  Pulse: 91 80 80 86  Resp: 20 19  18 18   Temp: 98.4 F (36.9 C) 98.2 F (36.8 C)  98.4 F (36.9 C)  TempSrc: Oral Oral  Oral  SpO2: 100%   99%  Weight:      Height:       General: alert, cooperative, and no distress Lochia: appropriate Uterine Fundus: firm Incision: N/A DVT Evaluation: No evidence of DVT seen on physical exam. No significant calf/ankle edema. Labs: Lab Results  Component Value Date   WBC 8.7 01/25/2024   HGB 11.2 (L) 01/25/2024   HCT 33.3 (L) 01/25/2024   MCV 79.9 (L) 01/25/2024   PLT 262 01/25/2024      Latest Ref Rng & Units 01/25/2024    5:38 PM  CMP  Glucose 70 - 99 mg/dL 87   BUN 6 - 20 mg/dL <5   Creatinine 1.47 - 1.00 mg/dL 8.29   Sodium 562 - 130 mmol/L 136   Potassium 3.5 - 5.1 mmol/L 3.7   Chloride 98 - 111 mmol/L 106   CO2 22 - 32 mmol/L 20   Calcium 8.9 - 10.3 mg/dL 8.6   Total Protein 6.5 - 8.1 g/dL 6.1   Total Bilirubin 0.0 - 1.2 mg/dL 0.5   Alkaline Phos 38 - 126 U/L 162   AST 15 - 41 U/L 16   ALT 0 - 44 U/L 10    Edinburgh Score:    01/26/2024    2:17 AM  Edinburgh Postnatal Depression Scale Screening Tool  I have been able to laugh and see the  funny side of things. 0  I have looked forward with enjoyment to things. 0  I have blamed myself unnecessarily when things went wrong. 1  I have been anxious or worried for no good reason. 0  I have felt scared or panicky for no good reason. 1  Things have been getting on top of me. 0  I have been so unhappy that I have had difficulty sleeping. 0  I have felt sad or miserable. 0  I have been so unhappy that I have been crying. 0  The thought of harming myself has occurred to me. 0  Edinburgh Postnatal Depression Scale Total 2   Edinburgh Postnatal Depression Scale Total: 2   After visit meds:  Allergies as of 01/27/2024   No Known Allergies      Medication List     STOP taking these medications    ondansetron 4 MG tablet Commonly known as: Zofran       TAKE these medications    ACCRUFeR 30 MG  Caps Generic drug: Ferric Maltol Take 1 capsule (30 mg total) by mouth 2 (two) times daily at 8 am and 10 pm.   ibuprofen 600 MG tablet Commonly known as: ADVIL Take 1 tablet (600 mg total) by mouth every 6 (six) hours.   PRENATAL VITAMIN PO Take 1 tablet by mouth daily.   senna-docusate 8.6-50 MG tablet Commonly known as: Senokot-S Take 2 tablets by mouth daily. Start taking on: January 28, 2024         Discharge home in stable condition Infant Feeding: Breast Infant Disposition:rooming in Discharge instruction: per After Visit Summary and Postpartum booklet. Activity: Advance as tolerated. Pelvic rest for 6 weeks.  Diet: routine diet Future Appointments: Future Appointments  Date Time Provider Department Center  02/23/2024 11:15 AM Lennart Pall, MD [facility] None   Follow up Visit:   Please schedule this patient for a In person postpartum visit in 4 weeks with the following provider: Any provider. Additional Postpartum F/U:  Low risk pregnancy complicated by:  Delivery mode:  Vaginal, Spontaneous Anticipated Birth Control:  Unsure   01/27/2024 Sundra Aland, MD

## 2024-01-25 NOTE — Anesthesia Preprocedure Evaluation (Signed)
 Anesthesia Evaluation  Patient identified by MRN, date of birth, ID band Patient awake    Reviewed: Allergy & Precautions, H&P , Patient's Chart, lab work & pertinent test results  Airway Mallampati: III  TM Distance: >3 FB Neck ROM: full    Dental no notable dental hx. (+) Teeth Intact   Pulmonary neg pulmonary ROS   Pulmonary exam normal breath sounds clear to auscultation       Cardiovascular negative cardio ROS  Rhythm:regular Rate:Normal     Neuro/Psych negative neurological ROS  negative psych ROS   GI/Hepatic Neg liver ROS,GERD  Medicated and Controlled,,  Endo/Other  Obesity  Renal/GU negative Renal ROS  negative genitourinary   Musculoskeletal   Abdominal  (+) + obese  Peds  Hematology negative hematology ROS (+) Blood dyscrasia, anemia   Anesthesia Other Findings   Reproductive/Obstetrics (+) Pregnancy                             Anesthesia Physical Anesthesia Plan  ASA: II  Anesthesia Plan: Epidural   Post-op Pain Management:    Induction:   PONV Risk Score and Plan:   Airway Management Planned:   Additional Equipment:   Intra-op Plan:   Post-operative Plan:   Informed Consent: I have reviewed the patients History and Physical, chart, labs and discussed the procedure including the risks, benefits and alternatives for the proposed anesthesia with the patient or authorized representative who has indicated his/her understanding and acceptance.       Plan Discussed with: Anesthesiologist  Anesthesia Plan Comments:         Anesthesia Quick Evaluation

## 2024-01-25 NOTE — H&P (Signed)
 Mary Mora is a 33 y.o. female [phone] with IUP at [redacted]w[redacted]d by LMP c/w 8 week [place] presenting for contractions.  She reports positive fetal movement. She denies leakage of fluid or vaginal bleeding.  Prenatal History/Complications: PNC at [facility] Pregnancy complications: anemia of pregnancy - Past Medical History: Past Medical History:  Diagnosis Date   Acid reflux    BV (bacterial vaginosis)    Heart burn    Infection    No pertinent past medical history     Past Surgical History: Past Surgical History:  Procedure Laterality Date   ESOPHAGEAL MANOMETRY N/A 06/28/2017   Procedure: ESOPHAGEAL MANOMETRY (EM);  Surgeon: Kathi Der, MD;  Location: [facility];  Service: Gastroenterology;  Laterality: N/A;    Obstetrical History: OB History     Gravida  4   Para  2   Term  2   Preterm      AB  1   Living  2      SAB      IAB  1   Ectopic      Multiple  0   Live Births  2            Social History: Social History   Socioeconomic History   Marital status: Single    Spouse name: Not on file   Number of children: Not on file   Years of education: Not on file   Highest education level: Not on file  Occupational History   Not on file  Tobacco Use   Smoking status: Never    Passive exposure: Never   Smokeless tobacco: Never  Vaping Use   Vaping status: Never Used  Substance and Sexual Activity   Alcohol use: No   Drug use: No   Sexual activity: Yes    Partners: Male  Other Topics Concern   Not on file  Social History Narrative   Not on file   Social Drivers of Health   Financial Resource Strain: Not on file  Food Insecurity: No Food Insecurity (01/25/2024)   Hunger Vital Sign    Worried About Running Out of Food in the Last Year: Never true    Ran Out of Food in the Last Year: Never true  Transportation Needs: No Transportation Needs (01/25/2024)   PRAPARE - Administrator, Civil Service (Medical): No    Lack of  Transportation (Non-Medical): No  Physical Activity: Not on file  Stress: Not on file  Social Connections: Not on file    Family History: Family History  Problem Relation Age of Onset   Hypertension Mother    Diabetes Father    Hypertension Father    Asthma Son    Cancer Maternal Grandmother    Diabetes Maternal Grandmother    Kidney disease Maternal Grandmother        on dialysis   Hypertension Paternal Grandmother    Aneurysm Paternal Grandmother     Allergies: No Known Allergies  Medications Prior to Admission  Medication Sig Dispense Refill Last Dose/Taking   Ferric Maltol (ACCRUFER) 30 MG CAPS Take 1 capsule (30 mg total) by mouth 2 (two) times daily at 8 am and 10 pm. 60 capsule 3 Past Week   Prenatal Vit-Fe Fumarate-FA (PRENATAL VITAMIN PO) Take 1 tablet by mouth daily.   01/24/2024 Bedtime   ondansetron (ZOFRAN) 4 MG tablet Take 1 tablet (4 mg total) by mouth every 8 (eight) hours as needed for nausea or vomiting. (Patient not taking: Reported  on 01/15/2024) 20 tablet 0     Review of Systems   Constitutional: Negative for fever and chills Eyes: Negative for visual disturbances Respiratory: Negative for shortness of breath, dyspnea Cardiovascular: Negative for chest pain or palpitations  Gastrointestinal: Negative for vomiting, diarrhea and constipation.  POSITIVE for abdominal pain (contractions) Genitourinary: Negative for dysuria and urgency Musculoskeletal: Negative for back pain, joint pain, myalgias  Neurological: Negative for dizziness and headaches  Blood pressure 132/79, pulse 78, temperature 97.8 F (36.6 C), temperature source Oral, resp. rate 16, height 5\' 2"  (1.575 m), weight 93 kg, last menstrual period 04/18/2023, SpO2 100%. General appearance: alert, cooperative, and no distress Lungs: normal respiratory effort Heart: regular rate and rhythm Abdomen: soft, non-tender; bowel sounds normal Extremities: Homans sign is negative, no sign of DVT DTR's  2+ Presentation: cephalic Fetal monitoring  Baseline: 140 bpm, Variability: Good {> 6 bpm), Accelerations: Reactive, and Decelerations: Absent Uterine activity  2-4 Dilation: 4.5 Effacement (%): 80 Station: -2 Exam by:: Boone Master, RN   Prenatal labs: ABO, Rh: --/--/O POS (04/03 2108) Antibody: NEG (04/03 2108) Rubella: 1.47 (09/10 1638) RPR: Non Reactive (12/20 0859)  HBsAg: Negative (09/10 1638)  HIV: Non Reactive (12/20 0859)  GBS: Negative/-- (03/03 1427)   NURSING  PROVIDER  Office Location [facility] Dating by LMP c/w U/S at 8 wks  PNC Model Traditional Anatomy U/S incomplete  Initiated care at  8wks                Language  English              LAB RESULTS   Support Person FOB Genetics NIPS: LR female AFP: neg    NT/IT (FT only)     Carrier Screen Horizon: neg  Rhogam  O/Positive/-- (09/10 1638) A1C/GTT Early: 89, 125, 91 Third trimester:   Flu Vaccine     TDaP Vaccine   Blood Type O/Positive/-- (09/10 1638)  Covid Vaccine Yes Antibody Negative (09/10 1638)    Rubella 1.47 (09/10 1638)  Feeding Plan breast RPR Non Reactive (12/20 0859)  Contraception Unsure HBsAg Negative (09/10 1638)  Circumcision Yes if female HIV Non Reactive (12/20 0859)  Pediatrician   [facility]/ Sheliah Hatch HCVAb Non Reactive (09/10 1638)  Prenatal Classes       Pap Diagnosis  Date Value Ref Range Status  08/16/2022   Final   - Negative for intraepithelial lesion or malignancy (NILM)    BTLConsent  GC/CT Initial:   36wks:    VBAC  Consent  GBS Negative/-- (03/03 1427) For PCN allergy, check sensitivities        DME Rx [X]  BP cuff [ ]  Weight Scale Waterbirth  [ ]  Class [ ]  Consent [ ]  CNM visit  PHQ9 & GAD7 [X]  new OB [  ] 28 weeks  [  ] 36 weeks Induction  [ ]  Orders Entered [ ] Foley Y/N     Prenatal Transfer Tool  Maternal Diabetes: No Genetic Screening: Normal Maternal Ultrasounds/Referrals: Normal Fetal Ultrasounds or other Referrals:  None Maternal Substance Abuse:  No Significant  Maternal Medications:  None Significant Maternal Lab Results: Group B Strep negative  Results for orders placed or performed during the hospital encounter of 01/25/24 (from the past 24 hours)  Urinalysis, Routine w reflex microscopic -Urine, Clean Catch   Collection Time: 01/25/24  4:32 PM  Result Value Ref Range   Color, Urine YELLOW YELLOW   APPearance CLEAR CLEAR   Specific Gravity, Urine <1.005 (L) 1.005 - 1.030  pH 7.0 5.0 - 8.0   Glucose, UA NEGATIVE NEGATIVE mg/dL   Hgb urine dipstick TRACE (A) NEGATIVE   Bilirubin Urine NEGATIVE NEGATIVE   Ketones, ur NEGATIVE NEGATIVE mg/dL   Protein, ur NEGATIVE NEGATIVE mg/dL   Nitrite NEGATIVE NEGATIVE   Leukocytes,Ua NEGATIVE NEGATIVE  Urinalysis, Microscopic (reflex)   Collection Time: 01/25/24  4:32 PM  Result Value Ref Range   RBC / HPF 0-5 0 - 5 RBC/hpf   WBC, UA 0-5 0 - 5 WBC/hpf   Bacteria, UA RARE (A) NONE SEEN   Squamous Epithelial / HPF 11-20 0 - 5 /HPF   Mucus PRESENT   Protein / creatinine ratio, urine   Collection Time: 01/25/24  5:29 PM  Result Value Ref Range   Creatinine, Urine 18 mg/dL   Total Protein, Urine <6 mg/dL   Protein Creatinine Ratio        0.00 - 0.15 mg/mg[Cre]  CBC   Collection Time: 01/25/24  5:38 PM  Result Value Ref Range   WBC 8.7 4.0 - 10.5 K/uL   RBC 4.17 3.87 - 5.11 MIL/uL   Hemoglobin 11.2 (L) 12.0 - 15.0 g/dL   HCT 40.9 (L) 81.1 - 91.4 %   MCV 79.9 (L) 80.0 - 100.0 fL   MCH 26.9 26.0 - 34.0 pg   MCHC 33.6 30.0 - 36.0 g/dL   RDW 78.2 95.6 - 21.3 %   Platelets 262 150 - 400 K/uL   nRBC 0.0 0.0 - 0.2 %  Comprehensive metabolic panel   Collection Time: 01/25/24  5:38 PM  Result Value Ref Range   Sodium 136 135 - 145 mmol/L   Potassium 3.7 3.5 - 5.1 mmol/L   Chloride 106 98 - 111 mmol/L   CO2 20 (L) 22 - 32 mmol/L   Glucose, Bld 87 70 - 99 mg/dL   BUN <5 (L) 6 - 20 mg/dL   Creatinine, Ser 0.86 0.44 - 1.00 mg/dL   Calcium 8.6 (L) 8.9 - 10.3 mg/dL   Total Protein 6.1 (L) 6.5 - 8.1  g/dL   Albumin 2.5 (L) 3.5 - 5.0 g/dL   AST 16 15 - 41 U/L   ALT 10 0 - 44 U/L   Alkaline Phosphatase 162 (H) 38 - 126 U/L   Total Bilirubin 0.5 0.0 - 1.2 mg/dL   GFR, Estimated >57 >84 mL/min   Anion gap 10 5 - 15  Type and screen   Collection Time: 01/25/24  9:08 PM  Result Value Ref Range   ABO/RH(D) O POS    Antibody Screen NEG    Sample Expiration      01/28/2024,2359 Performed at [facility], [address], [place], [place] [address]     Assessment: Mary Mora is a 33 y.o. [phone] with an IUP at [redacted]w[redacted]d presenting for early labor  Plan: #Labor: expectant management #Pain:  Per request #FWB Cat 1 #ID: GBS: neg  #MOF:  breast #MOC: unsure #Circ: n/s   Jacklyn Shell 01/25/2024, 10:29 PM

## 2024-01-25 NOTE — Anesthesia Procedure Notes (Signed)
 Epidural Patient location during procedure: OB Start time: 01/25/2024 11:15 PM End time: 01/25/2024 11:31 PM  Staffing Anesthesiologist: Lowella Curb, MD Performed: anesthesiologist   Preanesthetic Checklist Completed: patient identified, IV checked, site marked, risks and benefits discussed, surgical consent, monitors and equipment checked, pre-op evaluation and timeout performed  Epidural Patient position: sitting Prep: ChloraPrep Patient monitoring: heart rate, cardiac monitor, continuous pulse ox and blood pressure Approach: midline Location: L2-L3 Injection technique: LOR saline  Needle:  Needle type: Tuohy  Needle gauge: 17 G Needle length: 9 cm Needle insertion depth: 8 cm Catheter type: closed end flexible Catheter size: 20 Guage Catheter at skin depth: 12 cm Test dose: negative  Assessment Events: blood not aspirated, injection not painful, no injection resistance, no paresthesia and negative IV test  Additional Notes Reason for block:procedure for pain

## 2024-01-26 ENCOUNTER — Encounter (HOSPITAL_COMMUNITY): Payer: Self-pay | Admitting: Obstetrics and Gynecology

## 2024-01-26 LAB — RPR: RPR Ser Ql: NONREACTIVE

## 2024-01-26 MED ORDER — BENZOCAINE-MENTHOL 20-0.5 % EX AERO: 1.0000 | INHALATION_SPRAY | CUTANEOUS | Status: DC | PRN

## 2024-01-26 MED ORDER — FLEET ENEMA RE ENEM: 1.0000 | ENEMA | Freq: Every day | RECTAL | Status: DC | PRN

## 2024-01-26 MED ORDER — COCONUT OIL OIL: 1.0000 | TOPICAL_OIL | Status: DC | PRN

## 2024-01-26 MED ORDER — TETANUS-DIPHTH-ACELL PERTUSSIS 5-2.5-18.5 LF-MCG/0.5 IM SUSY: 0.5000 mL | PREFILLED_SYRINGE | Freq: Once | INTRAMUSCULAR | Status: DC

## 2024-01-26 MED ORDER — MEDROXYPROGESTERONE ACETATE 150 MG/ML IM SUSP: 150.0000 mg | INTRAMUSCULAR | Status: DC | PRN

## 2024-01-26 MED ORDER — BISACODYL 10 MG RE SUPP: 10.0000 mg | Freq: Every day | RECTAL | Status: DC | PRN

## 2024-01-26 MED ORDER — ONDANSETRON HCL 4 MG PO TABS: 4.0000 mg | ORAL_TABLET | ORAL | Status: DC | PRN

## 2024-01-26 MED ORDER — FERROUS SULFATE 325 (65 FE) MG PO TABS
325.0000 mg | ORAL_TABLET | ORAL | Status: DC
  Administered 2024-01-26: 325 mg via ORAL
  Filled 2024-01-26: qty 1

## 2024-01-26 MED ORDER — ONDANSETRON HCL 4 MG/2ML IJ SOLN: 4.0000 mg | INTRAMUSCULAR | Status: DC | PRN

## 2024-01-26 MED ORDER — METHYLERGONOVINE MALEATE 0.2 MG/ML IJ SOLN: 0.2000 mg | INTRAMUSCULAR | Status: DC | PRN

## 2024-01-26 MED ORDER — PRENATAL MULTIVITAMIN CH
1.0000 | ORAL_TABLET | Freq: Every day | ORAL | Status: DC
  Administered 2024-01-26 – 2024-01-27 (×2): 1 via ORAL
  Filled 2024-01-26 (×2): qty 1

## 2024-01-26 MED ORDER — MEASLES, MUMPS & RUBELLA VAC IJ SOLR: 0.5000 mL | Freq: Once | INTRAMUSCULAR | Status: DC

## 2024-01-26 MED ORDER — WITCH HAZEL-GLYCERIN EX PADS: 1.0000 | MEDICATED_PAD | CUTANEOUS | Status: DC | PRN

## 2024-01-26 MED ORDER — METHYLERGONOVINE MALEATE 0.2 MG PO TABS: 0.2000 mg | ORAL_TABLET | ORAL | Status: DC | PRN

## 2024-01-26 MED ORDER — SIMETHICONE 80 MG PO CHEW: 80.0000 mg | CHEWABLE_TABLET | ORAL | Status: DC | PRN

## 2024-01-26 MED ORDER — SENNOSIDES-DOCUSATE SODIUM 8.6-50 MG PO TABS
2.0000 | ORAL_TABLET | ORAL | Status: DC
Start: 2024-01-26 — End: 2024-01-27
  Administered 2024-01-26 – 2024-01-27 (×2): 2 via ORAL
  Filled 2024-01-26 (×2): qty 2

## 2024-01-26 MED ORDER — DIPHENHYDRAMINE HCL 25 MG PO CAPS: 25.0000 mg | ORAL_CAPSULE | Freq: Four times a day (QID) | ORAL | Status: DC | PRN

## 2024-01-26 MED ORDER — ACETAMINOPHEN 325 MG PO TABS: 650.0000 mg | ORAL_TABLET | ORAL | Status: DC | PRN

## 2024-01-26 MED ORDER — IBUPROFEN 600 MG PO TABS
600.0000 mg | ORAL_TABLET | Freq: Four times a day (QID) | ORAL | Status: DC
  Administered 2024-01-26 – 2024-01-27 (×6): 600 mg via ORAL
  Filled 2024-01-26 (×6): qty 1

## 2024-01-26 MED ORDER — DIBUCAINE (PERIANAL) 1 % EX OINT: 1.0000 | TOPICAL_OINTMENT | CUTANEOUS | Status: DC | PRN

## 2024-01-26 NOTE — Lactation Note (Signed)
 This note was copied from a baby's chart. Lactation Consultation Note  Patient Name: Girl Lamaya Hyneman [id number] Date: 01/26/2024 Age:34 hours Reason for consult: Initial assessment;Term MOB feels infant is latching well, infant BF for 15 minutes in L&D at 0100 am, infant is currently asleep in basinet. MOB is experienced with breastfeeding see maternal data below. MOB does not have any questions or concerns for LC at this time. MOB will continue to BF infant by cues, on demand, every 2-3 hours, skin to skin. MOB already knows how to hand express, LC reviewed hand expression with breast model. MOB knows to call for latch assistance if needed. LC discussed importance of maternal rest, balance meals and hydration. LC discussed infant's input and output. MOB was made aware of O/P services, breastfeeding support groups, community resources, and our phone # for post-discharge questions.    Maternal Data Has patient been taught Hand Expression?: Yes Does the patient have breastfeeding experience prior to this delivery?: Yes How long did the patient breastfeed?: Per MOB, she BF her 34 year old for 16 months.  Feeding Mother's Current Feeding Choice: Breast Milk  LATCH Score Latch: Grasps breast easily, tongue down, lips flanged, rhythmical sucking.  Audible Swallowing: Spontaneous and intermittent  Type of Nipple: Everted at rest and after stimulation  Comfort (Breast/Nipple): Soft / non-tender  Hold (Positioning): No assistance needed to correctly position infant at breast.  LATCH Score: 10   Lactation Tools Discussed/Used    Interventions Interventions: Breast feeding basics reviewed;Skin to skin;Position options;Hand express;Education;LC Services brochure  Discharge Pump: DEBP;Personal  Consult Status Consult Status: Follow-up Date: 01/26/24 Follow-up type: In-patient    Frederico Hamman 01/26/2024, 2:14 AM

## 2024-01-26 NOTE — Lactation Note (Signed)
 This note was copied from a baby's chart. Lactation Consultation Note  Patient Name: Mary Mora [id number] Date: 01/26/2024 Age:34 hours Reason for consult: Infant weight loss;Hyperbilirubinemia (-0.43 % weight loss, ABO incompatability currently on Billi lights).  LC entered the room, FOB holding sleeping  infant while on billi blanket , MOB informed LC infant recently BF for 20 minutes at 1530 pm. Per MOB ,infant recently has become more sleepy lately. Infant had 2 voids but no stool at 17 hours of life. LC reviewed how to hand express and infant was given 3 mls of colostrum by spoon. MOB plans to supplement infant with her own EBM first  that she hand express or pump. MOB has handout supplementing with breastfeeding. MOB will continue to supplement infant and pump until infant billi levels are within normal limits, If billi levels continue to rise she will latch infant first offer her EBM and then formula. MOB decline use of donor breast milk. LC set MOB up with DEBP with size 21 mm breast flange, MOB will continue to pump every 3 hours for 15 minutes on initial setting.   MOB is experienced with breastfeeding she BF her 1st child for 16 months. Current feeding Plan: 1-MOB will continue to BF infant first every feeding and afterwards supplement infant with any EBM first and then formula if needed until hyperbilirubinemia is resolved. 2- MOB will continue to use DEBP every 3 hours for 15 minutes on initial setting. 3- MOB will keep infant under billi blanket.  4- MOB has handout "Supplementing with Breastfeeding" MOB knows that her EBM is safe for 4 hours at room temperature whereas formula once open must be used within 1 hour. Maternal Data    Feeding Mother's Current Feeding Choice: Breast Milk  LATCH Score                    Lactation Tools Discussed/Used Tools: Flanges;Pump Flange Size: 21 Breast pump type: Double-Electric Breast Pump Pump Education: Milk  Storage;Setup, frequency, and cleaning Reason for Pumping: hyperbilirubinemia on billi lights due ABO incompatability. Pumping frequency: MOB will continue to use DEBP every 3 hours for 15 minutes on inital setting.  Interventions    Discharge Pump: DEBP;Personal (MOM Cozy DEBP)  Consult Status Consult Status: Follow-up Date: 01/27/24 Follow-up type: In-patient    Frederico Hamman 01/26/2024, 5:40 PM

## 2024-01-26 NOTE — Progress Notes (Signed)
 Post Partum Day 1 Subjective: no complaints, up ad lib, voiding and tolerating PO, small lochia, plans to breastfeed,  undecided about BC  Objective: Blood pressure 120/75, pulse 84, temperature 98.3 F (36.8 C), temperature source Oral, resp. rate 18, height 5\' 2"  (1.575 m), weight 93 kg, last menstrual period 04/18/2023, SpO2 100%, unknown if currently breastfeeding.  Physical Exam:  General: alert, cooperative and no distress Lochia:normal flow Chest: CTAB Heart: RRR no m/r/g Abdomen: +BS, soft, nontender,  Uterine Fundus: firm DVT Evaluation: No evidence of DVT seen on physical exam. Extremities: no edema  Recent Labs    01/25/24 1738  HGB 11.2*  HCT 33.3*    Assessment/Plan: Plan for discharge tomorrow, Breastfeeding, and Lactation consult   LOS: 1 day   Jacklyn Shell 01/26/2024, 7:31 AM

## 2024-01-26 NOTE — Anesthesia Postprocedure Evaluation (Signed)
 Anesthesia Post Note  Patient: Mary Mora  Procedure(s) Performed: AN AD HOC LABOR EPIDURAL     Patient location during evaluation: Mother Baby Anesthesia Type: Epidural Level of consciousness: awake and alert Pain management: pain level controlled Vital Signs Assessment: post-procedure vital signs reviewed and stable Respiratory status: spontaneous breathing, nonlabored ventilation and respiratory function stable Cardiovascular status: stable Postop Assessment: no headache, no backache, epidural receding, no apparent nausea or vomiting, patient able to bend at knees, able to ambulate and adequate PO intake Anesthetic complications: no   No notable events documented.  Last Vitals:  Vitals:   01/26/24 0147 01/26/24 0307  BP: 130/75 120/75  Pulse: 89 84  Resp: 18 18  Temp: 36.8 C 36.8 C  SpO2: 100% 100%    Last Pain:  Vitals:   01/26/24 0720  TempSrc:   PainSc: 0-No pain   Pain Goal:                   [employer]

## 2024-01-27 ENCOUNTER — Other Ambulatory Visit (HOSPITAL_COMMUNITY): Payer: Self-pay

## 2024-01-27 MED ORDER — SENNOSIDES-DOCUSATE SODIUM 8.6-50 MG PO TABS
2.0000 | ORAL_TABLET | ORAL | 0 refills | Status: AC
  Filled 2024-01-27: qty 60, 30d supply, fill #0

## 2024-01-27 MED ORDER — IBUPROFEN 600 MG PO TABS
600.0000 mg | ORAL_TABLET | Freq: Four times a day (QID) | ORAL | 0 refills | Status: AC
  Filled 2024-01-27: qty 40, 10d supply, fill #0

## 2024-01-27 NOTE — Lactation Note (Signed)
 This note was copied from a baby's chart. Lactation Consultation Note  Patient Name: Mary Mora [id number] Date: 01/27/2024 Age:34 hours Reason for consult: Follow-up assessment;Hyperbilirubinemia  P3, Baby on phototherapy.  Mother is primarily breastfeeding but is occasionally supplementing with formula. Discussed offering both breasts with feedings and waking baby for feeds if needed. Suggest post pumping and giving volume back to baby in addition to breastfeeding. Reviewed engorgement care and monitoring voids/stools.   Maternal Data Has patient been taught Hand Expression?: Yes  Feeding Mother's Current Feeding Choice: Breast Milk and Formula Interventions Interventions: Education;DEBP  Discharge Discharge Education: Engorgement and breast care;Warning signs for feeding baby Pump: Personal  Consult Status Consult Status: Follow-up Date: 01/28/24 Follow-up type: In-patient   [name]  RN, IBCLC 01/27/2024, 10:03 AM

## 2024-01-28 ENCOUNTER — Ambulatory Visit (HOSPITAL_COMMUNITY): Payer: Self-pay

## 2024-01-28 NOTE — Lactation Note (Signed)
 This note was copied from a baby's chart. Lactation Consultation Note  Patient Name: Mary Mora [id number] Date: 01/28/2024 Age:34 hours Reason for consult: Follow-up assessment;Hyperbilirubinemia  P3, Baby on phototherapy.  Discussed how to feed with paddle under baby.  Weight increased, bilirubin decreased.  4 voids/2 stools in the last 24 hours. Stools transitioning. Breasts filling.  Mother was able to pump 15 ml with her last pumping session.  Mother has been breastfeeding, pumping and supplementing with breastmilk and formula.  Baby is using Nfant white standard nipple which mother states is working well. Discussed waking baby for feeds if needed. Reviewed engorgement care and monitoring voids/stools.   Maternal Data Has patient been taught Hand Expression?: Yes  Feeding Mother's Current Feeding Choice: Breast Milk and Formula Nipple Type: Nfant Standard Flow (white)  Lactation Tools Discussed/Used Tools: Pump Breast pump type: Double-Electric Breast Pump Reason for Pumping: stimulation and supplementation Pumping frequency: q 3 hours for 15 min Pumped volume: 15 mL  Interventions Interventions: Education Consult Status Consult Status: Complete Date: 01/28/24   Hardie Pulley  RN, IBCLC 01/28/2024, 8:50 AM

## 2024-01-30 ENCOUNTER — Inpatient Hospital Stay (HOSPITAL_COMMUNITY)
Admission: RE | Admit: 2024-01-30 | Payer: BC Managed Care – PPO | Source: Home / Self Care | Admitting: Obstetrics and Gynecology

## 2024-01-30 ENCOUNTER — Inpatient Hospital Stay (HOSPITAL_COMMUNITY)

## 2024-02-06 ENCOUNTER — Encounter (HOSPITAL_COMMUNITY): Payer: Self-pay | Admitting: Obstetrics & Gynecology

## 2024-02-06 ENCOUNTER — Inpatient Hospital Stay (HOSPITAL_COMMUNITY)
Admission: AD | Admit: 2024-02-06 | Discharge: 2024-02-06 | Disposition: A | Discharge status: Home / Self Care | Attending: Obstetrics & Gynecology | Admitting: Obstetrics & Gynecology

## 2024-02-06 ENCOUNTER — Ambulatory Visit

## 2024-02-06 ENCOUNTER — Other Ambulatory Visit: Payer: Self-pay

## 2024-02-06 VITALS — BP 164/104 | HR 57

## 2024-02-06 DIAGNOSIS — Z0131 Encounter for examination of blood pressure with abnormal findings: Secondary | ICD-10-CM

## 2024-02-06 DIAGNOSIS — Z0130 Encounter for examination of blood pressure without abnormal findings: Secondary | ICD-10-CM

## 2024-02-06 DIAGNOSIS — O165 Unspecified maternal hypertension, complicating the puerperium: Secondary | ICD-10-CM | POA: Insufficient documentation

## 2024-02-06 LAB — COMPREHENSIVE METABOLIC PANEL WITH GFR
ALT: 18 U/L (ref 0–44)
AST: 22 U/L (ref 15–41)
Albumin: 3.4 g/dL — ABNORMAL LOW (ref 3.5–5.0)
Alkaline Phosphatase: 98 U/L (ref 38–126)
Anion gap: 12 (ref 5–15)
BUN: 6 mg/dL (ref 6–20)
CO2: 21 mmol/L — ABNORMAL LOW (ref 22–32)
Calcium: 9.1 mg/dL (ref 8.9–10.3)
Chloride: 108 mmol/L (ref 98–111)
Creatinine, Ser: 0.78 mg/dL (ref 0.44–1.00)
GFR, Estimated: >60 mL/min (ref >60)
Glucose, Bld: 84 mg/dL (ref 70–99)
Potassium: 3.8 mmol/L (ref 3.5–5.1)
Sodium: 141 mmol/L (ref 135–145)
Total Bilirubin: 0.4 mg/dL (ref 0.0–1.2)
Total Protein: 7.3 g/dL (ref 6.5–8.1)

## 2024-02-06 LAB — CBC
HCT: 41.1 % (ref 36.0–46.0)
Hemoglobin: 13.4 g/dL (ref 12.0–15.0)
MCH: 26.7 pg (ref 26.0–34.0)
MCHC: 32.6 g/dL (ref 30.0–36.0)
MCV: 82 fL (ref 80.0–100.0)
Platelets: 333 10*3/uL (ref 150–400)
RBC: 5.01 MIL/uL (ref 3.87–5.11)
RDW: 13.4 % (ref 11.5–15.5)
WBC: 6.4 10*3/uL (ref 4.0–10.5)
nRBC: 0 % (ref 0.0–0.2)

## 2024-02-06 MED ORDER — HYDRALAZINE HCL 20 MG/ML IJ SOLN: 10.0000 mg | INTRAMUSCULAR | Status: DC | PRN

## 2024-02-06 MED ORDER — LABETALOL HCL 5 MG/ML IV SOLN: 40.0000 mg | INTRAVENOUS | Status: DC | PRN

## 2024-02-06 MED ORDER — ACETAMINOPHEN 500 MG PO TABS
1000.0000 mg | ORAL_TABLET | Freq: Once | ORAL | Status: AC
Start: 2024-02-06 — End: 2024-02-06
  Administered 2024-02-06: 1000 mg via ORAL
  Filled 2024-02-06: qty 2

## 2024-02-06 MED ORDER — NIFEDIPINE ER 30 MG PO TB24: 30.0000 mg | ORAL_TABLET | Freq: Every day | ORAL | 1 refills | Status: AC

## 2024-02-06 MED ORDER — METOCLOPRAMIDE HCL 10 MG PO TABS: 10.0000 mg | ORAL_TABLET | Freq: Three times a day (TID) | ORAL | 0 refills | Status: AC | PRN

## 2024-02-06 MED ORDER — LABETALOL HCL 5 MG/ML IV SOLN: 80.0000 mg | INTRAVENOUS | Status: DC | PRN

## 2024-02-06 MED ORDER — FUROSEMIDE 20 MG PO TABS: 20.0000 mg | ORAL_TABLET | Freq: Every day | ORAL | Status: DC

## 2024-02-06 MED ORDER — LABETALOL HCL 5 MG/ML IV SOLN: 20.0000 mg | INTRAVENOUS | Status: DC | PRN

## 2024-02-06 MED ORDER — NIFEDIPINE 10 MG PO CAPS
10.0000 mg | ORAL_CAPSULE | Freq: Once | ORAL | Status: AC
  Administered 2024-02-06: 10 mg via ORAL
  Filled 2024-02-06: qty 1

## 2024-02-06 MED ORDER — NIFEDIPINE ER OSMOTIC RELEASE 30 MG PO TB24: 30.0000 mg | ORAL_TABLET | Freq: Two times a day (BID) | ORAL | Status: DC

## 2024-02-06 MED ORDER — METOCLOPRAMIDE HCL 10 MG PO TABS
10.0000 mg | ORAL_TABLET | Freq: Once | ORAL | Status: AC
  Administered 2024-02-06: 10 mg via ORAL
  Filled 2024-02-06: qty 1

## 2024-02-06 MED ORDER — NIFEDIPINE ER OSMOTIC RELEASE 30 MG PO TB24
30.0000 mg | ORAL_TABLET | Freq: Every day | ORAL | Status: DC
  Administered 2024-02-06: 30 mg via ORAL
  Filled 2024-02-06: qty 1

## 2024-02-06 NOTE — Progress Notes (Signed)
 Subjective:  Mary Mora is a 33 y.o. female here for BP check.   Hypertension ROS: taking medications as instructed, no medication side effects noted, no TIA's, no chest pain on exertion, no dyspnea on exertion, and no swelling of ankles. No reported HA, vision changes, SOB, RUQ pain.    Objective:  BP (!) 164/104   Pulse (!) 57   LMP 04/18/2023 (Exact Date)   Appearance alert, well appearing, and in no distress. General exam BP noted to be poorly controlled today in office.    Assessment:   Blood Pressure poorly controlled.   Plan:  Consulted with Dr. Donetta Furl and pt to report to MAU. Pt informed to head to MAU for evaluation. Pt voiced understanding.

## 2024-02-06 NOTE — MAU Note (Signed)
 Mary Mora is a 33 y.o. at Unknown here in MAU reporting: sent from [facility] office after having postpartum BP check.  Reports current HA, denies visual disturbances & epigastric pain.  States hasn't taken any meds to treat HA.   S/P NVD 01/25/2024  LMP: NA Onset of complaint: today Pain score: 6 Vitals:   02/06/24 1200  BP: (!) 178/94  Pulse: (!) 53  Resp: 18  Temp: 98.6 F (37 C)  SpO2: 99%     FHT: NA  Lab orders placed from triage: NA

## 2024-02-06 NOTE — MAU Provider Note (Signed)
 Event Date/Time   First Provider Initiated Contact with Patient 02/06/24 1228      S [name] is a 33 y.o. [phone] postpartum female who presents to MAU today with complaint of severely elevated blood pressure readings that she noticed this morning at her postpartum blood pressure check and a headache. She reports that this weekend she had an isolated experience of RUQ pain that lasted about 1 to 2 minutes, and it has not reoccurred since. She was urged by her OBGYN to come into the MAU to be seen. She reports that she is adequately hydrating at home. She endorsed previously intermittent shortness of breath on about day 5-7 PP and increased stress due to her baby being diagnosed with neonatal jaundice. She denies visual disturbances and extremity edema.  Receives care at [facility] for [facility]. Prenatal records reviewed.  Pertinent items noted in HPI and remainder of comprehensive ROS otherwise negative.   O BP (!) 147/93   Pulse 78   Temp 98.6 F (37 C) (Oral)   Resp 18   Ht 5\' 2"  (1.575 m)   Wt 86 kg   SpO2 99%   BMI 34.70 kg/m   Today's Vitals   02/06/24 1530 02/06/24 1536 02/06/24 1545 02/06/24 1554  BP: (!) 144/81  (!) 147/93   Pulse: 60  78   Resp:      Temp:      TempSrc:      SpO2: 98%  99%   Weight:      Height:      PainSc:  7   4    Body mass index is 34.7 kg/m.   Physical Exam Constitutional:      General: She is not in acute distress.    Appearance: Normal appearance. She is normal weight. She is not ill-appearing.  HENT:     Head: Normocephalic and atraumatic.     Right Ear: External ear normal.     Left Ear: External ear normal.     Nose: Nose normal. No congestion.     Mouth/Throat:     Mouth: Mucous membranes are moist.     Pharynx: Oropharynx is clear.  Eyes:     Extraocular Movements: Extraocular movements intact.     Conjunctiva/sclera: Conjunctivae normal.  Cardiovascular:     Rate and Rhythm: Normal rate.     Heart  sounds: No murmur heard.    No gallop.  Pulmonary:     Effort: Pulmonary effort is normal. No respiratory distress.     Breath sounds: Normal breath sounds.  Abdominal:     General: Abdomen is flat.     Palpations: Abdomen is soft.     Tenderness: There is no abdominal tenderness.  Musculoskeletal:        General: No swelling. Normal range of motion.  Skin:    General: Skin is warm and dry.  Neurological:     Mental Status: She is alert and oriented to person, place, and time. Mental status is at baseline.     Motor: No weakness.     Gait: Gait normal.  Psychiatric:        Mood and Affect: Mood normal.        Behavior: Behavior normal.        Thought Content: Thought content normal.    MDM: Moderate MAU Course:  The patient has multiple elevated blood pressure readings with systolic pressures >160. She endorses headache and previously shortness of breath; she denies vision changes  and swelling. Will collect CBC and CMP to r/o PP PreE given severe range Bps upon presentation.  Administered nifedipine 10 mg IR for acute BP control and 1g Tylenol for HA. Monitor for symptom improvement. Will continue to monitor for signs of preeclampsia with severe symptoms.   CBC plts 333, Hgb 13.4 CMP LFTs 22/18, Cr 0.78  Pt states HA is easing but still there despite the tylenol and better Bps.Will give Reglan PO 10mg  and reassess for improvement in HA.  Will also given Procardia 30mg  XL with plans to send out on that medication with BP check at [facility] in a couple days.    Patient with improvement and tolerated Procardia 30mg  XL.  Pt instructed to call [facility] to ensure BP check within next couple of days but that message had been sent as well.  Pt understanding and agreeable.  Stable for D/C.     AP #Postpartum Hypertension c/b Headache - bp check in OP [facility] within 48hrs, message to pool sent 4/15 - Procardia 30mg  XL QD  Allergies as of 02/06/2024   No Known Allergies      Medication  List     TAKE these medications    ACCRUFeR 30 MG Caps Generic drug: Ferric Maltol Take 1 capsule (30 mg total) by mouth 2 (two) times daily at 8 am and 10 pm.   ibuprofen 600 MG tablet Commonly known as: ADVIL Take 1 tablet (600 mg total) by mouth every 6 (six) hours.   metoCLOPramide 10 MG tablet Commonly known as: Reglan Take 1 tablet (10 mg total) by mouth every 8 (eight) hours as needed for nausea (HA).   NIFEdipine 30 MG 24 hr tablet Commonly known as: ADALAT CC Take 1 tablet (30 mg total) by mouth daily.   PRENATAL VITAMIN PO Take 1 tablet by mouth daily.   Senna-S 8.6-50 MG tablet Generic drug: senna-docusate Take 2 tablets by mouth daily.        Kathryn Parish, Medical Student 02/06/2024 1:27 PM   Evaluation and management procedures were performed by the [facility] under my supervision. I was immediately available for direct supervision, assistance and direction throughout this encounter.  I also confirm that I have verified the information documented in the student's note, and that I have also personally reperformed the pertinent components of the physical exam and all of the medical decision making activities.  I have also made any necessary editorial changes.   Candice Chalet, MD Family Medicine - Obstetrics Fellow

## 2024-02-08 ENCOUNTER — Ambulatory Visit

## 2024-02-08 VITALS — BP 135/89 | HR 89

## 2024-02-08 DIAGNOSIS — Z0130 Encounter for examination of blood pressure without abnormal findings: Secondary | ICD-10-CM

## 2024-02-08 NOTE — Progress Notes (Signed)
 Subjective:  Mary Mora is a 33 y.o. female here for BP check.   Hypertension ROS: taking medications as instructed, no medication side effects noted, no TIA's, no chest pain on exertion, no dyspnea on exertion, and no swelling of ankles.    Objective:  BP 135/89   Pulse 89   Appearance alert, well appearing, and in no distress. General exam BP noted to be well controlled today in office.    Assessment:   Blood Pressure improved.   Plan:  Current treatment plan is effective, no change in therapy.  Significantly improved for 2 days ago when pt here for BP check. Pt started on procardia from MAU visit, will continue. Reading today improved from readings in MAU. Encouraged pt to monitor for any symptoms, and to begin checking blood pressure at home, pt agreeable.

## 2024-02-23 ENCOUNTER — Encounter: Admitting: Obstetrics and Gynecology

## 2024-02-28 ENCOUNTER — Encounter: Payer: Self-pay | Admitting: Advanced Practice Midwife

## 2024-02-28 ENCOUNTER — Ambulatory Visit: Admitting: Advanced Practice Midwife

## 2024-02-28 DIAGNOSIS — Z3009 Encounter for other general counseling and advice on contraception: Secondary | ICD-10-CM | POA: Diagnosis not present

## 2024-02-28 MED ORDER — NORETHINDRONE 0.35 MG PO TABS: 1.0000 | ORAL_TABLET | Freq: Every day | ORAL | 11 refills | Status: AC

## 2024-02-28 NOTE — Progress Notes (Signed)
 Post Partum Visit Note  Mary Mora is a 33 y.o. [phone] female who presents for a postpartum visit. She is 4 weeks postpartum following a normal spontaneous vaginal delivery.  I have fully reviewed the prenatal and intrapartum course. The delivery was at 40 gestational weeks.  Anesthesia: epidural. Postpartum course has been ok. Baby is doing well yes. Baby is feeding by breast. Bleeding staining only. Bowel function is normal. Bladder function is normal. Patient is not sexually active. Contraception method is abstinence. Postpartum depression screening: negative.   The pregnancy intention screening data noted above was reviewed. Potential methods of contraception were discussed. The patient elected to proceed with No data recorded.   Edinburgh Postnatal Depression Scale - 02/28/24 1606       Edinburgh Postnatal Depression Scale:  In the Past 7 Days   I have been able to laugh and see the funny side of things. 0    I have looked forward with enjoyment to things. 0    I have blamed myself unnecessarily when things went wrong. 0    I have been anxious or worried for no good reason. 0    I have felt scared or panicky for no good reason. 0    Things have been getting on top of me. 0    I have been so unhappy that I have had difficulty sleeping. 0    I have felt sad or miserable. 0    I have been so unhappy that I have been crying. 0    The thought of harming myself has occurred to me. 0    Edinburgh Postnatal Depression Scale Total 0             Health Maintenance Due  Topic Date Due   DTaP/Tdap/Td (1 - Tdap) Never done   COVID-19 Vaccine (1 - 2024-25 season) Never done    The following portions of the patient's history were reviewed and updated as appropriate: allergies, current medications, past family history, past medical history, past social history, past surgical history, and problem list.  Review of Systems Pertinent items noted in HPI and remainder of  comprehensive ROS otherwise negative.  Objective:  BP 129/88   Pulse 65   Wt 191 lb (86.6 kg)   BMI 34.93 kg/m    VS reviewed, nursing note reviewed,  Constitutional: well developed, well nourished, no distress HEENT: normocephalic CV: normal rate Pulm/chest wall: normal effort Abdomen: soft Neuro: alert and oriented x 3 Skin: warm, dry Psych: affect normal   Assessment:   1. Postpartum care following vaginal delivery (Primary) --Doing well, bonding well with baby, good support at home   Plan:   Essential components of care per ACOG recommendations:  1.  Mood and well being: Patient with negative depression screening today. Reviewed local resources for support.  - Patient tobacco use? No.   - hx of drug use? No.    2. Infant care and feeding:  -Patient currently breastmilk feeding? Yes. Reviewed importance of draining breast regularly to support lactation.  -Social determinants of health (SDOH) reviewed in EPIC. No concerns  3. Sexuality, contraception and birth spacing - Patient does not want a pregnancy in the next year.  Pt was planning BTL but decided not to do anything permanent.  - Reviewed reproductive life planning. Reviewed contraceptive methods based on pt preferences and effectiveness.  Patient desired Oral Contraceptive today.   --POPs prescribed  4. Sleep and fatigue -Encouraged family/partner/community support of 4 hrs of  uninterrupted sleep to help with mood and fatigue  5. Physical Recovery  - Discussed patients delivery and complications. She describes her labor as mixed. - Patient had a Vaginal, no problems at delivery. Patient had no laceration. Perineal healing reviewed. Patient expressed understanding - Patient has urinary incontinence? No. - Patient is safe to resume physical and sexual activity  6.  Health Maintenance - HM due items addressed Yes - Last pap smear  Diagnosis  Date Value Ref Range Status  08/16/2022   Final   - Negative for  intraepithelial lesion or malignancy (NILM)   Pap smear not done at today's visit.  -Breast Cancer screening indicated? No.   7. Chronic Disease/Pregnancy Condition follow up: Hypertension --Pt is to continue Procardia , can do a trial at home without meds with home cuff.  F/U with PCP as soon as possible.   - PCP follow up  Arlester Bence, CNM [facility], [facility]

## 2024-02-28 NOTE — Progress Notes (Signed)
 No concerns at this time. Declines birth control.

## 2024-03-21 DIAGNOSIS — Z6834 Body mass index (BMI) 34.0-34.9, adult: Secondary | ICD-10-CM | POA: Diagnosis not present

## 2024-03-21 DIAGNOSIS — Z713 Dietary counseling and surveillance: Secondary | ICD-10-CM | POA: Diagnosis not present

## 2024-03-28 DIAGNOSIS — Z713 Dietary counseling and surveillance: Secondary | ICD-10-CM | POA: Diagnosis not present

## 2024-03-28 DIAGNOSIS — Z6834 Body mass index (BMI) 34.0-34.9, adult: Secondary | ICD-10-CM | POA: Diagnosis not present

## 2024-06-03 ENCOUNTER — Telehealth: Payer: Self-pay

## 2024-06-03 NOTE — Telephone Encounter (Signed)
 Pt prescription for breast pump signed and faxed today.

## 2024-08-29 ENCOUNTER — Other Ambulatory Visit (HOSPITAL_COMMUNITY)
Admission: RE | Admit: 2024-08-29 | Discharge: 2024-08-29 | Disposition: A | Discharge status: Home / Self Care | Source: Ambulatory Visit | Attending: Obstetrics | Admitting: Obstetrics

## 2024-08-29 ENCOUNTER — Encounter: Payer: Self-pay | Admitting: Obstetrics

## 2024-08-29 ENCOUNTER — Ambulatory Visit (INDEPENDENT_AMBULATORY_CARE_PROVIDER_SITE_OTHER): Admitting: Obstetrics

## 2024-08-29 ENCOUNTER — Other Ambulatory Visit: Payer: Self-pay | Admitting: Obstetrics

## 2024-08-29 VITALS — BP 158/95 | HR 84 | Ht 62.0 in | Wt 217.0 lb

## 2024-08-29 DIAGNOSIS — E669 Obesity, unspecified: Secondary | ICD-10-CM

## 2024-08-29 DIAGNOSIS — Z01419 Encounter for gynecological examination (general) (routine) without abnormal findings: Secondary | ICD-10-CM | POA: Diagnosis not present

## 2024-08-29 DIAGNOSIS — N6320 Unspecified lump in the left breast, unspecified quadrant: Secondary | ICD-10-CM

## 2024-08-29 DIAGNOSIS — Z3009 Encounter for other general counseling and advice on contraception: Secondary | ICD-10-CM

## 2024-08-29 DIAGNOSIS — O924 Hypogalactia: Secondary | ICD-10-CM

## 2024-08-29 DIAGNOSIS — N939 Abnormal uterine and vaginal bleeding, unspecified: Secondary | ICD-10-CM

## 2024-08-29 DIAGNOSIS — N946 Dysmenorrhea, unspecified: Secondary | ICD-10-CM

## 2024-08-29 DIAGNOSIS — Z30011 Encounter for initial prescription of contraceptive pills: Secondary | ICD-10-CM

## 2024-08-29 MED ORDER — SLYND 4 MG PO TABS: 1.0000 | ORAL_TABLET | Freq: Every day | ORAL | 11 refills | Status: AC

## 2024-08-29 MED ORDER — IBUPROFEN 800 MG PO TABS
800.0000 mg | ORAL_TABLET | Freq: Three times a day (TID) | ORAL | 5 refills | Status: AC | PRN
Start: 2024-08-29 — End: ?

## 2024-08-29 NOTE — Progress Notes (Unsigned)
 Pt presents for heavy painful periods, small hard lump on left breast. No other questions or concerns at this time.

## 2024-08-29 NOTE — Progress Notes (Unsigned)
 Subjective:        Mary Mora is a 33 y.o. female here for a routine exam.  Current complaints: Heavy and painful periods.  Left breast lump, non tender.  She is breast feeding and has decreased milk production.  Personal health questionnaire:  Is patient [name], have a family history of breast and/or ovarian cancer: no Is there a family history of uterine cancer diagnosed at age < 76, gastrointestinal cancer, urinary tract cancer, family member who is a Personnel Officer syndrome-associated carrier: no Is the patient overweight and hypertensive, family history of diabetes, personal history of gestational diabetes, preeclampsia or PCOS: no Is patient over 47, have PCOS,  family history of premature CHD under age 3, diabetes, smoke, have hypertension or peripheral artery disease:  no At any time, has a partner hit, kicked or otherwise hurt or frightened you?: no Over the past 2 weeks, have you felt down, depressed or hopeless?: no Over the past 2 weeks, have you felt little interest or pleasure in doing things?:no   Gynecologic History Patient's last menstrual period was 08/09/2024 (exact date). Contraception: none Last Pap: 2023. Results were: normal Last mammogram: n/a. Results were: n/a  Obstetric History OB History  Gravida Para Term Preterm AB Living  4 3 3  1 3   SAB IAB Ectopic Multiple Live Births   1  0 3    # Outcome Date GA Lbr Len/2nd Weight Sex Type Anes PTL Lv  4 Term 01/25/24 [redacted]w[redacted]d / 00:06 7 lb 12.2 oz (3.52 kg) F Vag-Spont EPI  LIV     Birth Comments: WNL  3 Term 12/03/14 [redacted]w[redacted]d 08:13 / 00:03 6 lb 6.8 oz (2.915 kg) M Vag-Spont EPI  LIV     Birth Comments: WNL  2 IAB 02/22/11 [redacted]w[redacted]d       DEC     Birth Comments: no complications  1 Term 03/20/10 [redacted]w[redacted]d  7 lb 14 oz (3.572 kg) M Vag-Spont EPI N LIV    Past Medical History:  Diagnosis Date   Acid reflux    BV (bacterial vaginosis)    Heart burn    Infection    No pertinent past medical history     Past  Surgical History:  Procedure Laterality Date   ESOPHAGEAL MANOMETRY N/A 06/28/2017   Procedure: ESOPHAGEAL MANOMETRY (EM);  Surgeon: Elicia Claw, MD;  Location: [facility];  Service: Gastroenterology;  Laterality: N/A;     Current Outpatient Medications:    Drospirenone (SLYND) 4 MG TABS, Take 1 tablet (4 mg total) by mouth daily before breakfast., Disp: 28 tablet, Rfl: 11   ibuprofen  (ADVIL ) 800 MG tablet, Take 1 tablet (800 mg total) by mouth every 8 (eight) hours as needed., Disp: 30 tablet, Rfl: 5   Ferric Maltol  (ACCRUFER ) 30 MG CAPS, Take 1 capsule (30 mg total) by mouth 2 (two) times daily at 8 am and 10 pm. (Patient not taking: Reported on 08/29/2024), Disp: 60 capsule, Rfl: 3   ibuprofen  (ADVIL ) 600 MG tablet, Take 1 tablet (600 mg total) by mouth every 6 (six) hours. (Patient not taking: Reported on 08/29/2024), Disp: 40 tablet, Rfl: 0   metoCLOPramide  (REGLAN ) 10 MG tablet, Take 1 tablet (10 mg total) by mouth every 8 (eight) hours as needed for nausea (HA). (Patient not taking: Reported on 08/29/2024), Disp: 30 tablet, Rfl: 0   NIFEdipine  (ADALAT  CC) 30 MG 24 hr tablet, Take 1 tablet (30 mg total) by mouth daily. (Patient not taking: Reported on 08/29/2024), Disp: 30  tablet, Rfl: 1   norethindrone  (MICRONOR ) 0.35 MG tablet, Take 1 tablet (0.35 mg total) by mouth daily. (Patient not taking: Reported on 08/29/2024), Disp: 28 tablet, Rfl: 11   Prenatal Vit-Fe Fumarate-FA (PRENATAL VITAMIN PO), Take 1 tablet by mouth daily. (Patient not taking: Reported on 08/29/2024), Disp: , Rfl:    senna-docusate (SENOKOT-S) 8.6-50 MG tablet, Take 2 tablets by mouth daily. (Patient not taking: Reported on 08/29/2024), Disp: 60 tablet, Rfl: 0 No Known Allergies  Social History   Tobacco Use   Smoking status: Never    Passive exposure: Never   Smokeless tobacco: Never  Substance Use Topics   Alcohol use: No    Family History  Problem Relation Age of Onset   Hypertension Mother    Diabetes Father     Hypertension Father    Asthma Son    Cancer Maternal Grandmother    Diabetes Maternal Grandmother    Kidney disease Maternal Grandmother        on dialysis   Hypertension Paternal Grandmother    Aneurysm Paternal Grandmother       Review of Systems  Constitutional: negative for fatigue and weight loss Respiratory: negative for cough and wheezing Cardiovascular: negative for chest pain, fatigue and palpitations Gastrointestinal: negative for abdominal pain and change in bowel habits Musculoskeletal:negative for myalgias Neurological: negative for gait problems and tremors Behavioral/Psych: negative for abusive relationship, depression Endocrine: negative for temperature intolerance    Genitourinary:negative for abnormal menstrual periods, genital lesions, hot flashes, sexual problems and vaginal discharge Integument/breast: negative for breast lump, breast tenderness, nipple discharge and skin lesion(s)    Objective:       BP (!) 158/95   Pulse 84   Ht 5' 2 (1.575 m)   Wt 217 lb (98.4 kg)   LMP 08/09/2024 (Exact Date)   BMI 39.69 kg/m  General:   Alert and no distress  Skin:   no rash or abnormalities  Lungs:   clear to auscultation bilaterally  Heart:   regular rate and rhythm, S1, S2 normal, no murmur, click, rub or gallop  Breasts:   normal without suspicious masses, skin or nipple changes or axillary nodes  Abdomen:  normal findings: no organomegaly, soft, non-tender and no hernia  Pelvis:  External genitalia: normal general appearance Urinary system: urethral meatus normal and bladder without fullness, nontender Vaginal: normal without tenderness, induration or masses Cervix: normal appearance Adnexa: normal bimanual exam Uterus: anteverted and non-tender, normal size   Lab Review Urine pregnancy test Labs reviewed yes Radiologic studies reviewed no  I have spent a total of 20 minutes of face-to-face time, excluding clinical staff time, reviewing notes and  preparing to see patient, ordering tests and/or medications, and counseling the patient.   Assessment:    1. Encounter for gynecological examination with Papanicolaou smear of cervix (Primary) Rx: - Cytology - PAP( [place])  2. Abnormal uterine bleeding (AUB) - will try OCP regulation of cycles  3. Mass of left breast, unspecified quadrant Rx - MM 3D DIAGNOSTIC MAMMOGRAM BILATERAL BREAST; Future  4. Severe dysmenorrhea Rx: - ibuprofen  (ADVIL ) 800 MG tablet; Take 1 tablet (800 mg total) by mouth every 8 (eight) hours as needed.  Dispense: 30 tablet; Refill: 5  5. Encounter for other general counseling and advice on contraception - options discussed.  Wants OCP's.  6. Encounter for initial prescription of contraceptive pills Rx: - Drospirenone (SLYND) 4 MG TABS; Take 1 tablet (4 mg total) by mouth daily before breakfast.  Dispense: 28  tablet; Refill: 11  7. Obesity (BMI 35.0-39.9 without comorbidity) - weight reduction with the aid of dietary changes, exercise and behavioral modification encouraged  8 Decreased milk production - Fenugreek recommended     Plan:    Education reviewed: calcium supplements, depression evaluation, low fat, low cholesterol diet, safe sex/STD prevention, self breast exams, and weight bearing exercise. Contraception: oral progesterone-only contraceptive. Follow up in: 2 weeks.   Meds ordered this encounter  Medications   ibuprofen  (ADVIL ) 800 MG tablet    Sig: Take 1 tablet (800 mg total) by mouth every 8 (eight) hours as needed.    Dispense:  30 tablet    Refill:  5   Drospirenone (SLYND) 4 MG TABS    Sig: Take 1 tablet (4 mg total) by mouth daily before breakfast.    Dispense:  28 tablet    Refill:  11   Orders Placed This Encounter  Procedures   MM 3D DIAGNOSTIC MAMMOGRAM BILATERAL BREAST    BCBS # BASELINE/Left breast lump/ NO NEEDS NO BR SXS/TOMO W/PT PT AWARE 24 HR CX/ $75 NSF    Standing Status:   Future    Expiration Date:    08/29/2025    Reason for Exam (SYMPTOM  OR DIAGNOSIS REQUIRED):   Left breast lump    Is the patient pregnant?:   No             Breast feeding    Preferred imaging location?:   [facility]    CARLIN RONAL CENTERS, MD, FACOG Attending Obstetrician & Gynecologist, [facility] for [facility], [facility], [place] 08/29/2024

## 2024-08-30 LAB — CYTOLOGY - PAP
Comment: NEGATIVE
Diagnosis: NEGATIVE
High risk HPV: NEGATIVE

## 2024-12-04 ENCOUNTER — Ambulatory Visit: Admitting: Family Medicine
